# Patient Record
Sex: Female | Born: 1980
Health system: Southern US, Community
[De-identification: ages and names within clinical notes are randomized; demographics above are authoritative.]

## PROBLEM LIST (undated history)

## (undated) DIAGNOSIS — F32A Depression, unspecified: Secondary | ICD-10-CM

## (undated) DIAGNOSIS — F419 Anxiety disorder, unspecified: Secondary | ICD-10-CM

## (undated) DIAGNOSIS — K589 Irritable bowel syndrome without diarrhea: Secondary | ICD-10-CM

## (undated) DIAGNOSIS — G43909 Migraine, unspecified, not intractable, without status migrainosus: Secondary | ICD-10-CM

## (undated) DIAGNOSIS — F329 Major depressive disorder, single episode, unspecified: Secondary | ICD-10-CM

## (undated) HISTORY — PX: CHOLECYSTECTOMY: SHX55

## (undated) HISTORY — DX: Anxiety disorder, unspecified: F41.9

## (undated) HISTORY — DX: Migraine, unspecified, not intractable, without status migrainosus: G43.909

## (undated) HISTORY — DX: Major depressive disorder, single episode, unspecified: F32.9

## (undated) HISTORY — PX: TONSILLECTOMY: SUR1361

## (undated) HISTORY — DX: Depression, unspecified: F32.A

## (undated) HISTORY — DX: Irritable bowel syndrome without diarrhea: K58.9

---

## 2001-03-10 HISTORY — PX: LEEP: SHX91

## 2003-06-08 ENCOUNTER — Other Ambulatory Visit: Payer: Self-pay

## 2004-04-09 ENCOUNTER — Emergency Department: Payer: Self-pay | Admitting: Unknown Physician Specialty

## 2005-12-01 ENCOUNTER — Emergency Department: Payer: Self-pay | Admitting: Emergency Medicine

## 2006-04-05 ENCOUNTER — Encounter: Payer: Self-pay | Admitting: Obstetrics & Gynecology

## 2006-04-05 ENCOUNTER — Ambulatory Visit: Payer: Self-pay | Admitting: Obstetrics & Gynecology

## 2006-06-13 ENCOUNTER — Ambulatory Visit: Payer: Self-pay | Admitting: Family Medicine

## 2006-06-23 ENCOUNTER — Ambulatory Visit (HOSPITAL_COMMUNITY): Admission: RE | Admit: 2006-06-23 | Discharge: 2006-06-23 | Payer: Self-pay | Admitting: Family Medicine

## 2006-06-27 ENCOUNTER — Ambulatory Visit: Payer: Self-pay | Admitting: Family Medicine

## 2006-07-25 ENCOUNTER — Ambulatory Visit: Payer: Self-pay | Admitting: Family Medicine

## 2006-07-28 ENCOUNTER — Ambulatory Visit (HOSPITAL_COMMUNITY): Admission: RE | Admit: 2006-07-28 | Discharge: 2006-07-28 | Payer: Self-pay | Admitting: Obstetrics & Gynecology

## 2006-08-15 ENCOUNTER — Ambulatory Visit: Payer: Self-pay | Admitting: Family Medicine

## 2006-09-04 ENCOUNTER — Ambulatory Visit (HOSPITAL_COMMUNITY): Admission: RE | Admit: 2006-09-04 | Discharge: 2006-09-04 | Payer: Self-pay | Admitting: Obstetrics & Gynecology

## 2006-09-06 ENCOUNTER — Ambulatory Visit (HOSPITAL_COMMUNITY): Admission: RE | Admit: 2006-09-06 | Discharge: 2006-09-06 | Payer: Self-pay | Admitting: Gynecology

## 2006-09-06 ENCOUNTER — Ambulatory Visit: Payer: Self-pay | Admitting: Obstetrics & Gynecology

## 2006-10-04 ENCOUNTER — Ambulatory Visit: Payer: Self-pay | Admitting: Obstetrics & Gynecology

## 2006-11-02 ENCOUNTER — Ambulatory Visit: Payer: Self-pay | Admitting: Obstetrics & Gynecology

## 2006-11-30 ENCOUNTER — Ambulatory Visit: Payer: Self-pay | Admitting: Obstetrics & Gynecology

## 2006-11-30 ENCOUNTER — Ambulatory Visit (HOSPITAL_COMMUNITY): Admission: RE | Admit: 2006-11-30 | Discharge: 2006-11-30 | Payer: Self-pay | Admitting: Gynecology

## 2006-12-01 ENCOUNTER — Ambulatory Visit: Payer: Self-pay | Admitting: Gynecology

## 2006-12-04 ENCOUNTER — Ambulatory Visit: Payer: Self-pay | Admitting: Obstetrics & Gynecology

## 2006-12-06 ENCOUNTER — Ambulatory Visit: Payer: Self-pay | Admitting: Obstetrics & Gynecology

## 2006-12-13 ENCOUNTER — Ambulatory Visit (HOSPITAL_COMMUNITY): Admission: RE | Admit: 2006-12-13 | Discharge: 2006-12-13 | Payer: Self-pay | Admitting: Obstetrics & Gynecology

## 2006-12-13 ENCOUNTER — Ambulatory Visit: Payer: Self-pay | Admitting: Obstetrics & Gynecology

## 2006-12-19 ENCOUNTER — Ambulatory Visit: Payer: Self-pay | Admitting: Family Medicine

## 2006-12-25 ENCOUNTER — Ambulatory Visit: Payer: Self-pay | Admitting: Obstetrics & Gynecology

## 2006-12-27 ENCOUNTER — Ambulatory Visit: Payer: Self-pay | Admitting: Obstetrics & Gynecology

## 2007-01-09 ENCOUNTER — Inpatient Hospital Stay (HOSPITAL_COMMUNITY): Admission: AD | Admit: 2007-01-09 | Discharge: 2007-01-10 | Payer: Self-pay | Admitting: Obstetrics & Gynecology

## 2007-01-09 ENCOUNTER — Ambulatory Visit: Payer: Self-pay | Admitting: Advanced Practice Midwife

## 2007-01-15 ENCOUNTER — Ambulatory Visit: Payer: Self-pay | Admitting: Gynecology

## 2007-01-20 ENCOUNTER — Inpatient Hospital Stay (HOSPITAL_COMMUNITY): Admission: AD | Admit: 2007-01-20 | Discharge: 2007-01-20 | Payer: Self-pay | Admitting: Obstetrics & Gynecology

## 2007-01-22 ENCOUNTER — Ambulatory Visit: Payer: Self-pay | Admitting: Obstetrics & Gynecology

## 2007-01-26 ENCOUNTER — Ambulatory Visit: Payer: Self-pay | Admitting: *Deleted

## 2007-01-26 ENCOUNTER — Inpatient Hospital Stay (HOSPITAL_COMMUNITY): Admission: AD | Admit: 2007-01-26 | Discharge: 2007-01-27 | Payer: Self-pay | Admitting: Obstetrics & Gynecology

## 2007-01-31 ENCOUNTER — Ambulatory Visit: Payer: Self-pay | Admitting: Obstetrics & Gynecology

## 2007-02-02 ENCOUNTER — Ambulatory Visit: Payer: Self-pay | Admitting: Physician Assistant

## 2007-02-02 ENCOUNTER — Inpatient Hospital Stay (HOSPITAL_COMMUNITY): Admission: AD | Admit: 2007-02-02 | Discharge: 2007-02-04 | Payer: Self-pay | Admitting: Gynecology

## 2007-03-11 HISTORY — PX: INTRAUTERINE DEVICE INSERTION: SHX323

## 2007-03-21 ENCOUNTER — Ambulatory Visit: Payer: Self-pay | Admitting: Gynecology

## 2007-03-21 ENCOUNTER — Encounter (INDEPENDENT_AMBULATORY_CARE_PROVIDER_SITE_OTHER): Payer: Self-pay | Admitting: Gynecology

## 2007-03-28 ENCOUNTER — Ambulatory Visit: Payer: Self-pay | Admitting: Obstetrics & Gynecology

## 2008-05-05 ENCOUNTER — Ambulatory Visit: Payer: Self-pay | Admitting: Family Medicine

## 2008-05-05 DIAGNOSIS — F411 Generalized anxiety disorder: Secondary | ICD-10-CM | POA: Insufficient documentation

## 2008-05-05 DIAGNOSIS — R5383 Other fatigue: Secondary | ICD-10-CM

## 2008-05-05 DIAGNOSIS — F329 Major depressive disorder, single episode, unspecified: Secondary | ICD-10-CM

## 2008-05-05 DIAGNOSIS — R5381 Other malaise: Secondary | ICD-10-CM | POA: Insufficient documentation

## 2008-05-05 DIAGNOSIS — F325 Major depressive disorder, single episode, in full remission: Secondary | ICD-10-CM | POA: Insufficient documentation

## 2008-05-05 LAB — CONVERTED CEMR LAB
ALT: 17 units/L (ref 0–35)
BUN: 10 mg/dL (ref 6–23)
Basophils Relative: 1.5 % (ref 0.0–3.0)
CO2: 28 meq/L (ref 19–32)
Chloride: 106 meq/L (ref 96–112)
Cholesterol: 139 mg/dL (ref 0–200)
Eosinophils Relative: 1.9 % (ref 0.0–5.0)
HCT: 42.8 % (ref 36.0–46.0)
LDL Cholesterol: 81 mg/dL (ref 0–99)
Lymphs Abs: 2.6 10*3/uL (ref 0.7–4.0)
MCV: 90.6 fL (ref 78.0–100.0)
Monocytes Absolute: 0.5 10*3/uL (ref 0.1–1.0)
Platelets: 278 10*3/uL (ref 150.0–400.0)
Potassium: 4 meq/L (ref 3.5–5.1)
TSH: 1.89 microintl units/mL (ref 0.35–5.50)
Total Bilirubin: 0.9 mg/dL (ref 0.3–1.2)
Total Protein: 7.2 g/dL (ref 6.0–8.3)
WBC: 7.6 10*3/uL (ref 4.5–10.5)

## 2008-05-07 LAB — CONVERTED CEMR LAB

## 2008-05-09 ENCOUNTER — Telehealth: Payer: Self-pay | Admitting: Family Medicine

## 2008-05-30 ENCOUNTER — Ambulatory Visit: Payer: Self-pay | Admitting: Family Medicine

## 2008-05-30 ENCOUNTER — Encounter: Payer: Self-pay | Admitting: Family Medicine

## 2008-05-30 ENCOUNTER — Other Ambulatory Visit: Admission: RE | Admit: 2008-05-30 | Discharge: 2008-05-30 | Payer: Self-pay | Admitting: Family Medicine

## 2008-06-02 ENCOUNTER — Encounter (INDEPENDENT_AMBULATORY_CARE_PROVIDER_SITE_OTHER): Payer: Self-pay | Admitting: *Deleted

## 2008-07-16 ENCOUNTER — Ambulatory Visit: Payer: Self-pay | Admitting: Family Medicine

## 2008-08-29 ENCOUNTER — Ambulatory Visit: Payer: Self-pay | Admitting: Family Medicine

## 2009-03-03 ENCOUNTER — Ambulatory Visit: Payer: Self-pay | Admitting: Family Medicine

## 2009-05-05 ENCOUNTER — Ambulatory Visit: Payer: Self-pay | Admitting: Family Medicine

## 2009-06-04 ENCOUNTER — Other Ambulatory Visit: Admission: RE | Admit: 2009-06-04 | Discharge: 2009-06-04 | Payer: Self-pay | Admitting: Family Medicine

## 2009-06-04 ENCOUNTER — Ambulatory Visit: Payer: Self-pay | Admitting: Family Medicine

## 2009-06-04 DIAGNOSIS — R609 Edema, unspecified: Secondary | ICD-10-CM | POA: Insufficient documentation

## 2009-06-04 DIAGNOSIS — M461 Sacroiliitis, not elsewhere classified: Secondary | ICD-10-CM | POA: Insufficient documentation

## 2009-06-04 DIAGNOSIS — M549 Dorsalgia, unspecified: Secondary | ICD-10-CM | POA: Insufficient documentation

## 2009-06-09 LAB — CONVERTED CEMR LAB
BUN: 11 mg/dL (ref 6–23)
Bilirubin, Direct: 0.1 mg/dL (ref 0.0–0.3)
CO2: 33 meq/L — ABNORMAL HIGH (ref 19–32)
Chloride: 105 meq/L (ref 96–112)
Creatinine, Ser: 0.7 mg/dL (ref 0.4–1.2)
Eosinophils Absolute: 0.2 10*3/uL (ref 0.0–0.7)
MCHC: 34.3 g/dL (ref 30.0–36.0)
MCV: 91 fL (ref 78.0–100.0)
Monocytes Absolute: 0.6 10*3/uL (ref 0.1–1.0)
Neutrophils Relative %: 62.8 % (ref 43.0–77.0)
Platelets: 258 10*3/uL (ref 150.0–400.0)
Pro B Natriuretic peptide (BNP): 18.1 pg/mL (ref 0.0–100.0)
Total Bilirubin: 0.4 mg/dL (ref 0.3–1.2)
Total Protein: 7.4 g/dL (ref 6.0–8.3)

## 2009-06-10 ENCOUNTER — Encounter (INDEPENDENT_AMBULATORY_CARE_PROVIDER_SITE_OTHER): Payer: Self-pay | Admitting: *Deleted

## 2009-06-10 LAB — CONVERTED CEMR LAB: Pap Smear: NEGATIVE

## 2009-11-04 ENCOUNTER — Ambulatory Visit: Payer: Self-pay | Admitting: Family Medicine

## 2009-11-05 ENCOUNTER — Telehealth: Payer: Self-pay | Admitting: Family Medicine

## 2009-11-16 ENCOUNTER — Ambulatory Visit: Payer: Self-pay | Admitting: Internal Medicine

## 2010-02-09 NOTE — Assessment & Plan Note (Signed)
Summary: CPX /W/PAP/RBH   Vital Signs:  Patient profile:   30 year old female Height:      65.75 inches Weight:      218.4 pounds BMI:     35.65 Temp:     98.2 degrees F oral Pulse rate:   84 / minute Pulse rhythm:   regular BP sitting:   110 / 70  (left arm) Cuff size:   large  Vitals Entered By: Benny Lennert CMA Duncan Dull) (Jun 04, 2009 2:47 PM)  History of Present Illness: Chief complaint cpx with pap, other c/o  30 year old female:   Health maintenance.  2. Back pain, R low sided back pain. No radiculpathy. No specific injury. Has tried ibuprofen. Some heat. No prior longstanding history of back problems, no history of surgery. 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 LE edema, has had a lot more LE edema in the past few weeks as wel  4. Dep / anx - doing well, needs refill of SSRI  Preventive Screening-Counseling & Management  Alcohol-Tobacco     Alcohol drinks/day: <1     Alcohol Counseling: not indicated; patient does not drink     Smoking Status: never     Tobacco Counseling: not indicated; no tobacco use  Caffeine-Diet-Exercise     Caffeine use/day: mild     Diet Comments: improved, more fiber, lower sugar     Diet Counseling: to improve diet; diet is suboptimal     Does Patient Exercise: no     Exercise Counseling: to improve exercise regimen  Hep-HIV-STD-Contraception     Hepatitis Risk: risk noted     Hepatitis Risk Counseling: to avoid increased hepatitis risk     HIV Risk: no risk noted     HIV Risk Counseling: not indicated-no HIV risk noted     STD Risk: no risk noted     STD Risk Counseling: not indicated-no STD risk noted     Contraception Counseling: not applicable     SBE Education/Counseling: to perform regular SBE     Sun Exposure-Excessive: yes     Sun Exposure Counseling: to decrease sun exposure  Safety-Violence-Falls     Violence in the Home: no risk noted     Sexual Abuse: no     Fall Risk Counseling: not  indicated; no significant falls noted      Sexual History:  currently monogamous.        Drug Use:  never.        Blood Transfusions:  no.    Clinical Review Panels:  Prevention   Last Pap Smear:  NEGATIVE FOR INTRAEPITHELIAL LESIONS OR MALIGNANCY. (05/30/2008)  Lipid Management   Cholesterol:  139 (05/05/2008)   LDL (bad choesterol):  81 (05/05/2008)   HDL (good cholesterol):  44.30 (05/05/2008)  CBC   WBC:  7.6 (05/05/2008)   RBC:  4.72 (05/05/2008)   Hgb:  14.7 (05/05/2008)   Hct:  42.8 (05/05/2008)   Platelets:  278.0 (05/05/2008)   MCV  90.6 (05/05/2008)   MCHC  34.2 (05/05/2008)   RDW  11.9 (05/05/2008)   PMN:  55.8 (05/05/2008)   Lymphs:  34.8 (05/05/2008)   Monos:  6.0 (05/05/2008)   Eosinophils:  1.9 (05/05/2008)   Basophil:  1.5 (05/05/2008)  Complete Metabolic Panel   Glucose:  92 (05/05/2008)   Sodium:  140 (05/05/2008)   Potassium:  4.0 (05/05/2008)   Chloride:  106 (05/05/2008)   CO2:  28 (05/05/2008)  BUN:  10 (05/05/2008)   Creatinine:  0.7 (05/05/2008)   Albumin:  4.1 (05/05/2008)   Total Protein:  7.2 (05/05/2008)   Calcium:  9.0 (05/05/2008)   Total Bili:  0.9 (05/05/2008)   Alk Phos:  42 (05/05/2008)   SGPT (ALT):  17 (05/05/2008)   SGOT (AST):  20 (05/05/2008)   Allergies: 1)  ! Morphine 2)  ! Percocet  Past History:  Past medical, surgical, family and social histories (including risk factors) reviewed, and no changes noted (except as noted below).  Past Medical History: Reviewed history from 05/05/2008 and no changes required. Anxiety Depression  Past Surgical History: Reviewed history from 05/05/2008 and no changes required. IUD, Mirena, 03/2007 LEEP, 03/2001  Family History: Reviewed history from 05/05/2008 and no changes required. Alcoholism, Drug Addiction: DAD Colon CA: 0 Ovarian/Uterine CA: 0 Breast CA: GM Lung CA: 0 Prostate CA: 0 CAD: 0 CVA: 0 Sudden death < 50: GF, not sure why DM: 0 Mental Illness: 0    Social History: Reviewed history from 05/05/2008 and no changes required. 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  Cardiovascular: Denies chest pains, palpitations, syncope, dyspnea on exertion,  Respiratory: Denies cough, dyspnea at rest, excessive sputum,wheeezing GI: Denies nausea, vomiting, diarrhea, constipation, change in bowel habits, abdominal pain, melena, hematochezia GU: Denies dysuria, hematuria, discharge, urinary frequency, urinary hesitancy, nocturia, incontinence, genital sores, decreased libido Musculoskeletal: AS ABOVE Derm: Denies rash, itching Neuro: Denies  paresthesias, frequent falls, frequent headaches, and difficulty walking.  Psych: Denies depression, anxiety Endocrine: Denies cold intolerance, heat intolerance, polydipsia, polyphagia, polyuria, and unusual weight change.  Heme: Denies enlarged lymph nodes Allergy: No hayfever  LE EDEMA AS ABOVE  Otherwise, the pertinent positives and negatives are listed above and in the HPI, otherwise a full review of systems has been reviewed and is negative unless noted positive.   Physical Exam  General:  Well-developed,well-nourished,in no acute distress; alert,appropriate and cooperative throughout examination Head:  Normocephalic and atraumatic without obvious abnormalities. No apparent alopecia or balding. Eyes:  pupils equal, pupils round, pupils reactive to light, pupils react to accomodation, corneas and lenses clear, and no injection.   Ears:  External ear exam shows no significant lesions or deformities.  Otoscopic examination reveals clear  canals, tympanic membranes are intact bilaterally without bulging, retraction, inflammation or discharge. Hearing is grossly normal bilaterally. Nose:  External nasal examination shows no deformity or inflammation. Nasal mucosa are pink and moist without lesions or exudates. Mouth:  Oral mucosa and oropharynx without lesions or exudates.  Teeth in good repair. Neck:  No deformities, masses, or tenderness noted. Chest Wall:  No deformities, masses, or tenderness noted. Breasts:  No mass, nodules, thickening, tenderness, bulging, retraction, inflamation, nipple discharge or skin changes noted.   Lungs:  Normal respiratory effort, chest expands symmetrically. Lungs are clear to auscultation, no crackles or wheezes. Heart:  Normal rate and regular rhythm. S1 and S2 normal without gallop, murmur, click, rub or other extra sounds. Abdomen:  Bowel sounds positive,abdomen soft and non-tender without masses, organomegaly or hernias noted. Genitalia:  Normal introitus for age, no external lesions, no vaginal discharge, mucosa pink and moist, no vaginal or cervical lesions, no vaginal atrophy,  no friaility or hemorrhage, normal uterus size and position, no adnexal masses or tenderness Msk:  Range of motion at  the waist: full Flexion, ext, rotation, minimal to no pain  No echymosis or edema Rises to examination table with no difficulty nonantalgic  Inspection/Deformity: N Paraspinus T: Y - R lower  B Ankle Dorsiflexion (L5,4): 5/5 B Great Toe Dorsiflexion (L5,4): 5/5 Heel Walk (L5): WNL Toe Walk (S1): WNL Rise/Squat (L4): WNL, mild pain  SENSORY B Medial Foot (L4): WNL B Dorsum (L5): WNL B Lateral (S1): WNL Light Touch: WNL Pinprick: WNL  REFLEXES Knee (L4): 2+ Ankle (S1): 2+  B SLR, seated: neg B SLR, supine: neg B FABER: neg B Reverse FABER: TTP on R B Greater Troch: NT B Log Roll: neg B Stork: NT B Sciatic Notch: R TTP Leg Lengths: equal  Extremities:  tr - 1+ foot and ankle  edema, tr le edema Neurologic:  alert & oriented X3, sensation intact to light touch, and gait normal.   Cervical Nodes:  No lymphadenopathy noted Axillary Nodes:  No palpable lymphadenopathy Inguinal Nodes:  No significant adenopathy Psych:  Cognition and judgment appear intact. Alert and cooperative with normal attention span and concentration. No apparent delusions, illusions, hallucinations   Impression & Recommendations:  Problem # 1:  HEALTH MAINTENANCE EXAM (ICD-V70.0) The patient's preventative maintenance and recommended screening tests for an annual wellness exam were reviewed in full today. Brought up to date unless services declined.  Counselled on the importance of diet, exercise, and its role in overall health and mortality. The patient's FH and SH was reviewed, including their home life, tobacco status, and drug and alcohol status.   Problem # 2:  BACK PAIN (ICD-724.5) Assessment: New Part SI dyfunction, part focal LBP  Conservative management for now and progress.  Start with medications, core rehab, and progress from there following low back pain algorithm. Does not appear to be acute disk pathology clinically. No red flags.  Her updated medication list for this problem includes:    Diclofenac Sodium 75 Mg Tbec (Diclofenac sodium) .Marland Kitchen... 1 by mouth two times a day    Cyclobenzaprine Hcl 10 Mg Tabs (Cyclobenzaprine hcl) .Marland Kitchen... 1/2 to 1 tab as needed for back pain  Problem # 3:  SACROILIITIS, RIGHT (ICD-720.2) Assessment: New A rehabilitation program was given to the patient emphasizing increasing ROM at [facility] joint, increasing overall hip and piriformis flexibility, and hip, flexor and abductor strength.   Problem # 4:  EDEMA (ICD-782.3) Assessment: New Likely with being up on feet all day Compression stockings  Check bnp and basic labs  Orders: TLB-BNP (B-Natriuretic Peptide) (83880-BNPR)  Problem # 5:  DEPRESSION (ICD-311) Assessment: Improved  Her updated  medication list for this problem includes:    Citalopram Hydrobromide 40 Mg Tabs (Citalopram hydrobromide) .Marland Kitchen... Take one tablet by mouth daily  Problem # 6:  ANXIETY (ICD-300.00) Assessment: Improved  Her updated medication list for this problem includes:    Citalopram Hydrobromide 40 Mg Tabs (Citalopram hydrobromide) .Marland Kitchen... Take one tablet by mouth daily  Complete Medication List: 1)  Citalopram Hydrobromide 40 Mg Tabs (Citalopram hydrobromide) .... Take one tablet by mouth daily 2)  Diclofenac Sodium 75 Mg Tbec (Diclofenac sodium) .Marland Kitchen.. 1 by mouth two times a day 3)  Cyclobenzaprine Hcl 10 Mg Tabs (Cyclobenzaprine hcl) .... 1/2 to 1 tab as needed for back pain  Other Orders: Venipuncture (16109) TLB-BMP (Basic Metabolic Panel-BMET) (80048-METABOL) TLB-CBC Platelet - w/Differential (85025-CBCD) TLB-Hepatic/Liver Function Pnl (80076-HEPATIC) TLB-Cholesterol, Direct LDL (  83721-DIRLDL) Prescriptions: CYCLOBENZAPRINE HCL 10 MG  TABS (CYCLOBENZAPRINE HCL) 1/2 to 1 tab as needed for back pain  #30 x 2   Entered and Authorized by:   Hannah Beat MD   Signed by:   Hannah Beat MD on 06/04/2009   Method used:   Print then Give to Patient   RxID:   [id number] DICLOFENAC SODIUM 75 MG TBEC (DICLOFENAC SODIUM) 1 by mouth two times a day  #60 x 2   Entered and Authorized by:   Hannah Beat MD   Signed by:   Hannah Beat MD on 06/04/2009   Method used:   Print then Give to Patient   RxID:   [id number] CITALOPRAM HYDROBROMIDE 40 MG TABS (CITALOPRAM HYDROBROMIDE) take one tablet by mouth daily  #30 x 11   Entered and Authorized by:   Hannah Beat MD   Signed by:   Hannah Beat MD on 06/04/2009   Method used:   Print then Give to Patient   RxID:   [id number]   Current Allergies (reviewed today): ! MORPHINE ! PERCOCET

## 2010-02-09 NOTE — Letter (Signed)
Summary: Out of Work  [employer] at [facility]  [address] [place], [place] [address]   Phone: [phone]  Fax: [phone]    November 04, 2009   Employee:  Cheyenne Rivera    To Whom It May Concern:   For Medical reasons, please excuse the above named employee from work for the following dates:  Start:   11/04/2009  End:   can return 11/06/2009 if she is feeling better   If you need additional information, please feel free to contact our office.         Sincerely,    Judith Part MD

## 2010-02-09 NOTE — Letter (Signed)
Summary: Results Follow up Letter  [place] at [facility]  [address] [place], [place] [address]   Phone: [phone]  Fax: [phone]    06/10/2009 MRN: [id number]    [facility] [address] [place], [place]  [address]    Dear Cheyenne Rivera,  The following are the results of your recent test(s):  Test         Result    Pap Smear:        Normal __X___  Not Normal _____ Comments:  Yearly follow up is recommended. ______________________________________________________ Cholesterol: LDL(Bad cholesterol):         Your goal is less than:         HDL (Good cholesterol):       Your goal is more than: Comments:  ______________________________________________________ Mammogram:        Normal _____  Not Normal _____ Comments:  ___________________________________________________________________ Hemoccult:        Normal _____  Not normal _______ Comments:    _____________________________________________________________________ Other Tests:    We routinely do not discuss normal results over the telephone.  If you desire a copy of the results, or you have any questions about this information we can discuss them at your next office visit.   Sincerely,    Hannah Beat, M.D.  Salley Scarlet

## 2010-02-09 NOTE — Assessment & Plan Note (Signed)
Summary: ears,st,congested,coughing/alc   Vital Signs:  Patient profile:   30 year old female Height:      65.75 inches Weight:      215.50 pounds BMI:     35.17 Temp:     97.9 degrees F oral Pulse rate:   76 / minute Pulse rhythm:   regular BP sitting:   110 / 70  (left arm) Cuff size:   regular  Vitals Entered By: Linde Gillis CMA Duncan Dull) (March 03, 2009 9:18 AM) CC: ear pain, soret throat, congestion, cough   Acute Visit History:      The patient complains of cough, earache, fever, nasal discharge, sinus problems, and sore throat.  These symptoms began 5 days ago.  She denies headache.  Other comments include: sudden onset body aches continues to get worse Using tylenol.        Her highest temperature has been 102.        The cough interferes with her sleep.  The character of the cough is described as productive.  There is no history of wheezing or shortness of breath associated with her cough.        The earache is located on both sides.        She complains of teeth aching and nasal congestion.        Problems Prior to Update: 1)  Uri  (ICD-465.9) 2)  Wrist Sprain  (ICD-842.00) 3)  Health Maintenance Exam  (ICD-V70.0) 4)  Depression  (ICD-311) 5)  Anxiety  (ICD-300.00) 6)  Fatigue  (ICD-780.79) 7)  Encounter For Long-term Use of Other Medications  (ICD-V58.69) 8)  Screening, Diabetes Mellitus  (ICD-V77.1) 9)  Screening For Lipoid Disorders  (ICD-V77.91) 10)  Sexually Transmitted Disease, Exposure To  (ICD-V01.6)  Current Medications (verified): 1)  Citalopram Hydrobromide 40 Mg Tabs (Citalopram Hydrobromide) .... Take One Tablet By Mouth Daily 2)  Hycodan Suspension .Marland Kitchen.. 1 - 2 Tsp By Mouth At Bedtime As Needed Cough  Allergies: 1)  ! Morphine 2)  ! Percocet  Past History:  Past medical, surgical, family and social histories (including risk factors) reviewed, and no changes noted (except as noted below).  Past Medical History: Reviewed history from  05/05/2008 and no changes required. Anxiety Depression  Past Surgical History: Reviewed history from 05/05/2008 and no changes required. IUD, Mirena, 03/2007 LEEP, 03/2001  Family History: Reviewed history from 05/05/2008 and no changes required. Alcoholism, Drug Addiction: DAD Colon CA: 0 Ovarian/Uterine CA: 0 Breast CA: GM Lung CA: 0 Prostate CA: 0 CAD: 0 CVA: 0 Sudden death < 50: GF, not sure why DM: 0 Mental Illness: 0   Social History: Reviewed history from 05/05/2008 and no changes required. Marital Status: Married Children: 2 Occupation: Sales executive Drug use-no Regular exercise-no Rare ETOH  Review of Systems General:  Complains of fatigue. CV:  Denies chest pain or discomfort. GI:  Denies abdominal pain.  Physical Exam  General:  overweight female in NAD Head:  no maxiallry sinus ttp Ears:  clear fluid B TMS Nose:  nasl discharge clear Mouth:  MMMpharynx pink and moist.   Neck:  no cervical or supraclavicular lymphadenopathy cervical lymphadenopathy.   Lungs:  Normal respiratory effort, chest expands symmetrically. Lungs are clear to auscultation, no crackles or wheezes. Heart:  Normal rate and regular rhythm. S1 and S2 normal without gallop, murmur, click, rub or other extra sounds.   Impression & Recommendations:  Problem # 1:  INFLUENZA, WITH RESPIRATORY SYMPTOMS (ICD-487.1) Discussed symptomatic care.  Hydration, rest.  Call if SOB, cough worsening or prolongued fever. Reviewed flu timeline. She has no health problems that put her at risk for complications and is out of timeline for tamiflu , so we decided against use of tamiflu. Pt agreed. Discussed family prophylaxis, her children are out of recommended timeline for tamiflu prophylaxsis. She was advised to not return to work until 24 hour after fever resolved on no antipyretics.    Complete Medication List: 1)  Citalopram Hydrobromide 40 Mg Tabs (Citalopram hydrobromide) .... Take one tablet by  mouth daily 2)  Hycodan Suspension  .Marland Kitchen.. 1 - 2 tsp by mouth at bedtime as needed cough  Patient Instructions: 1)  Hydration, rest. Call if SOB, cough worsening or prolongued fever. Mucinex, nasal saline.  2)  She was advised to not return to work until 24 hour after fever resolved on no antipyretics.  Prescriptions: HYCODAN SUSPENSION 1 - 2 tsp by mouth at bedtime as needed cough  #8 oz x 0   Entered and Authorized by:   Kerby Nora MD   Signed by:   Kerby Nora MD on 03/03/2009   Method used:   Print then Give to Patient   RxID:   [id number]   Current Allergies (reviewed today): ! MORPHINE ! PERCOCET

## 2010-02-09 NOTE — Progress Notes (Signed)
Summary: needs something for cough  Phone Note Call from Patient   Caller: Patient Call For: Hannah Beat MD Reason for Call: Talk to Nurse Summary of Call: Pt was given cough medicine at last office visit.  This is making her sick, causing nausea.  She asked if something different can be called to [facility], something that will help her sleep. Initial call taken by: Lowella Petties CMA, AAMA,  November 05, 2009 2:28 PM  Follow-up for Phone Call        Tussionex susp, 1/2-1 tsp by mouth at bedtime as needed cough, 240 mL Follow-up by: Hannah Beat MD,  November 05, 2009 2:45 PM  Additional Follow-up for Phone Call Additional follow up Details #1::        Advised pt, medicine called to [facility] pharmacy. Additional Follow-up by: Lowella Petties CMA, AAMA,  November 05, 2009 3:07 PM     Prior Medications: CITALOPRAM HYDROBROMIDE 40 MG TABS (CITALOPRAM HYDROBROMIDE) take one tablet by mouth daily DICLOFENAC SODIUM 75 MG TBEC (DICLOFENAC SODIUM) 1 by mouth two times a day as needed CYCLOBENZAPRINE HCL 10 MG  TABS (CYCLOBENZAPRINE HCL) 1/2 to 1 tab as needed for back pain THERAFLU SEVERE COLD DAYTIME 15-5-325 MG TABS (DM-PHENYLEPHRINE-ACETAMINOPHEN) OTC As directed. THERAFLU SEVERE COLD/CGH NIGHT 25-10-650 MG PACK (DIPHENHYDRAMINE-PE-APAP) OTC As directed. Current Allergies: ! MORPHINE ! PERCOCET

## 2010-02-09 NOTE — Assessment & Plan Note (Signed)
Summary: sore throat/cough/dlo   Vital Signs:  Patient profile:   30 year old female Weight:      214 pounds O2 Sat:      98 % on Room air Temp:     98.5 degrees F oral Pulse rate:   92 / minute Pulse rhythm:   regular BP sitting:   120 / 80  (left arm) Cuff size:   large  Vitals Entered By: Selena Batten Dance CMA (AAMA) (November 16, 2009 3:30 PM)  O2 Flow:  Room air CC: Cough/ear ache   History of Present Illness: JX:BJYNW, congestion.  came in 2 wks ago, congested, ST, coughing.  Took abx (zpack) and cough syrup (tussionex).  Still has tussionex.  Felt better after a few days.  3d ago started having ST, coughing again.  productive of mild mucous.  Taking thermaflu during day, tussionex at night.  doing vicks as well.  Not helping much.  ear fullness R side.    No abd pain, fevers/chills, n/v/d, rashes, myalgias, arthralgias.  + daughters with cough.  both had flu mist last week.  No smokers at home.  no h/o asthma  Missed 2 days work last 2 wks, now left early today.  can't afford to miss much more work.    Current Medications (verified): 1)  Theraflu Severe Cold Daytime 15-5-325 Mg Tabs (Dm-Phenylephrine-Acetaminophen) .... Otc As Directed.  Allergies: 1)  ! Morphine 2)  ! Percocet  Past History:  Past Medical History: Last updated: 05/05/2008 Anxiety Depression  Social History: Last updated: 05/05/2008 Marital Status: Married Children: 2 Occupation: Sales executive Drug use-no Regular exercise-no Rare ETOH PMH-FH-SH reviewed for relevance  Review of Systems       per HPI  Physical Exam  General:  overweight, congested, nontoxic. Head:  normocephalic, atraumatic, and no abnormalities observed.  no sinus tenderness  Eyes:  vision grossly intact, pupils equal, pupils round, pupils reactive to light, and no injection.   Ears:  R ear normal and L ear normal.   Nose:  nares are boggy but clear  Mouth:  clear post nasal drip noted pharynx pink and moist, no  erythema, and no exudates.   Neck:  No deformities, masses, or tenderness noted.  no LAD Lungs:  poor air movement.  no wheezing.  + bibasilar harsh breath sounds.  O2 sat 98%. Heart:  Normal rate and regular rhythm. S1 and S2 normal without gallop, murmur, click, rub or other extra sounds. Pulses:  2+ rad pulses Extremities:  no pedal edema, brisk cap refill Skin:  Intact without suspicious lesions or rashes   Impression & Recommendations:  Problem # 1:  COUGH (ICD-786.2) post-viral vs bronchitis.  already treated with zpack.  no indication of further bacterial infection.  supportive care for now.  given poor air movement, provided with albuterol to open airways.  CXR clear to my read.  if different rad read, will call with change in plan.  red flags to return discussed.  Orders: T-2 View CXR (71020TC)  Complete Medication List: 1)  Theraflu Severe Cold Daytime 15-5-325 Mg Tabs (Dm-phenylephrine-acetaminophen) .... Otc as directed.  Patient Instructions: 1)  Could be residual of bronchitis.  The cough can last up to 4 weeks to go away. 2)  Push fluids.  guaifenesin IR 400mg  1 pill in am and 1 at noon to help cough up mucous. 3)  Use medication as prescribed: albuterol inh every 4-6 hours as needed 4)  Please return if you are not improving as  expected, or if you have high fevers (>101.5) or difficulty swallowing. 5)  Call clinic with questions.  Pleasure to see you today!    Orders Added: 1)  T-2 View CXR [71020TC] 2)  Est. Patient Level III [54627]     Current Allergies (reviewed today): ! MORPHINE ! PERCOCET  Appended Document: sore throat/cough/dlo    Clinical Lists Changes  Medications: Added new medication of VENTOLIN HFA 108 (90 BASE) MCG/ACT AERS (ALBUTEROL SULFATE) 1-2 puffs q4-6 hours as needed wheezing - Signed Rx of VENTOLIN HFA 108 (90 BASE) MCG/ACT AERS (ALBUTEROL SULFATE) 1-2 puffs q4-6 hours as needed wheezing;  #1 x 3;  Signed;  Entered by: Eustaquio Boyden  MD;  Authorized by: Eustaquio Boyden  MD;  Method used: Electronically to [facility] [address], [place], [place]  [address], Ph: [phone], Fax: [phone]    Prescriptions: VENTOLIN HFA 108 (90 BASE) MCG/ACT AERS (ALBUTEROL SULFATE) 1-2 puffs q4-6 hours as needed wheezing  #1 x 3   Entered and Authorized by:   Eustaquio Boyden  MD   Signed by:   Eustaquio Boyden  MD on 11/16/2009   Method used:   Electronically to        [facility]* (retail)       [address]       [place], [place]  [address]       Ph: [phone]       Fax: [phone]   RxID:   [id number]

## 2010-02-09 NOTE — Letter (Signed)
Summary: Out of Work  [employer] at [facility]  [address] [place], [place] [address]   Phone: [phone]  Fax: [phone]    May 05, 2009   Employee:  Cheyenne Rivera    To Whom It May Concern:   For Medical reasons, please excuse the above named employee from work for the following dates:  Start:   05/05/2009  End:   can retun 4/28 if she is feeling better   If you need additional information, please feel free to contact our office.         Sincerely,    Judith Part MD

## 2010-02-09 NOTE — Assessment & Plan Note (Signed)
Summary: fever,cough,ST   Vital Signs:  Patient profile:   30 year old female Height:      65.75 inches Weight:      213 pounds BMI:     34.77 Temp:     97.9 degrees F oral Pulse rate:   84 / minute Pulse rhythm:   regular BP sitting:   108 / 72  (left arm) Cuff size:   large  Vitals Entered By: Lewanda Rife LPN (November 04, 2009 3:12 PM) CC: fever, raw throat, from coughing, non productive cough but this AM pt coughed up green mucus. Pressure behind lt ear.   History of Present Illness: started getting sick on saturday night - really tired and had to lie down sunday started with hoarseness  then sore throat and cough not a lot of nasal symptoms (theraflu-- does help some)  not helping the cough at night  feels sweats and thinks she was febrile pressure behind L ear and a little dizzy   cough is a little prod of green mucous   30 year old has a little cold  last week 2 people at work sick  Allergies: 1)  ! Morphine 2)  ! Percocet  Past History:  Past Medical History: Last updated: 05/05/2008 Anxiety Depression  Past Surgical History: Last updated: 05/05/2008 IUD, Mirena, 03/2007 LEEP, 03/2001  Family History: Last updated: 05/05/2008 Alcoholism, Drug Addiction: DAD Colon CA: 0 Ovarian/Uterine CA: 0 Breast CA: GM Lung CA: 0 Prostate CA: 0 CAD: 0 CVA: 0 Sudden death < 50: GF, not sure why DM: 0 Mental Illness: 0   Social History: Last updated: 05/05/2008 Marital Status: Married Children: 2 Occupation: Sales executive Drug use-no Regular exercise-no Rare ETOH  Risk Factors: Alcohol Use: <1 (06/04/2009) Caffeine Use: mild (06/04/2009) Diet: improved, more fiber, lower sugar (06/04/2009) Exercise: no (06/04/2009)  Risk Factors: Smoking Status: never (06/04/2009)  Review of Systems General:  Complains of chills, fatigue, fever, and malaise. Eyes:  Denies blurring, discharge, and eye irritation. ENT:  Complains of earache, nasal congestion,  postnasal drainage, sinus pressure, and sore throat; denies ear discharge. CV:  Denies chest pain or discomfort and palpitations. Resp:  Complains of cough, sputum productive, and wheezing. GI:  Denies diarrhea, nausea, and vomiting. Derm:  Denies itching, lesion(s), poor wound healing, and rash. Neuro:  Complains of headaches; denies numbness and tingling.  Physical Exam  General:  overweight but generally well appearing  Head:  normocephalic, atraumatic, and no abnormalities observed.  no sinus tenderness  Eyes:  vision grossly intact, pupils equal, pupils round, pupils reactive to light, and no injection.   Ears:  R ear normal and L ear normal.   Nose:  nares are boggy but clear  Mouth:  clear post nasal drip noted pharynx pink and moist, no erythema, and no exudates.   Lungs:  Normal respiratory effort, chest expands symmetrically. Lungs are clear to auscultation, no crackles or wheezes. bs are harsh at bases  Skin:  Intact without suspicious lesions or rashes Cervical Nodes:  No lymphadenopathy noted Psych:  normal affect, talkative and pleasant    Impression & Recommendations:  Problem # 1:  URI (ICD-465.9) Assessment New viral with possible early viral bronchitis recommend sympt care- see pt instructions   given px for hycodan for night (she has used it in past and not allergic)  fluids/ rest / off work tomorrow  update if worse if worse or not imp over wkend will fill px for zpak and call Her updated medication  list for this problem includes:    Diclofenac Sodium 75 Mg Tbec (Diclofenac sodium) .Marland Kitchen... 1 by mouth two times a day as needed    Theraflu Severe Cold Daytime 15-5-325 Mg Tabs (Dm-phenylephrine-acetaminophen) ..... Otc as directed.    Theraflu Severe Cold/cgh Night 25-10-650 Mg Pack (Diphenhydramine-pe-apap) ..... Otc as directed.  Complete Medication List: 1)  Citalopram Hydrobromide 40 Mg Tabs (Citalopram hydrobromide) .... Take one tablet by mouth daily 2)   Diclofenac Sodium 75 Mg Tbec (Diclofenac sodium) .Marland Kitchen.. 1 by mouth two times a day as needed 3)  Cyclobenzaprine Hcl 10 Mg Tabs (Cyclobenzaprine hcl) .... 1/2 to 1 tab as needed for back pain 4)  Theraflu Severe Cold Daytime 15-5-325 Mg Tabs (Dm-phenylephrine-acetaminophen) .... Otc as directed. 5)  Theraflu Severe Cold/cgh Night 25-10-650 Mg Pack (Diphenhydramine-pe-apap) .... Otc as directed. 6)  Hycodan Cough Suspension  .Marland Kitchen.. 1-2 teaspoons by mouth at bedtime as needed for cough 7)  Zithromax Z-pak 250 Mg Tabs (Azithromycin) .... Take by mouth as directed for worsening bronchitis  Patient Instructions: 1)  you can try mucinex over the counter twice daily as directed and nasal saline spray for congestion 2)  tylenol over the counter as directed may help with aches, headache and fever  (I think theraflu has tylenol in it)  3)  call if symptoms worsen or if not improved in 4-5 days  4)  if not improved at that time - go ahead and fill px for zithromax 5)  try the hycodan for cough with caution - due to sedation Prescriptions: ZITHROMAX Z-PAK 250 MG TABS (AZITHROMYCIN) take by mouth as directed for worsening bronchitis  #1 pack x 0   Entered and Authorized by:   Judith Part MD   Signed by:   Judith Part MD on 11/04/2009   Method used:   Print then Give to Patient   RxID:   [id number] [facility] COUGH SUSPENSION 1-2 teaspoons by mouth at bedtime as needed for cough  #120cc x 0   Entered and Authorized by:   Judith Part MD   Signed by:   Judith Part MD on 11/04/2009   Method used:   Print then Give to Patient   RxID:   [id number]    Orders Added: 1)  Est. Patient Level III [[address]]    Current Allergies (reviewed today): ! MORPHINE ! PERCOCET

## 2010-02-09 NOTE — Assessment & Plan Note (Signed)
Summary: FEVER, THROAT,COUGH/DLO   Vital Signs:  Patient profile:   30 year old female Height:      65.75 inches Weight:      215.25 pounds BMI:     35.13 Temp:     98.8 degrees F oral Pulse rate:   84 / minute Pulse rhythm:   regular BP sitting:   110 / 70  (left arm) Cuff size:   large  Vitals Entered By: Lewanda Rife LPN (May 05, 2009 3:32 PM) CC: fever, productive cough with yellow phlegm, h/a and sorethroat (does not want strep test until Dr. Milinda Antis checks pt.)   History of Present Illness: woke up yesterday with cough and sore throat  st worsened as the day went on  faint headache  now really congested and cannot sleep  fever low grade 100.0  dad is sick - with similar symptoms  also sick person at work   taking sudafed and delsym   cough up yellow phlegm   lives with parents that smoke - that is a problem she does not smoke   needs proof of hep B immunity for new job on fri-- needs blood drawn for that   Allergies: 1)  ! Morphine 2)  ! Percocet  Past History:  Past Medical History: Last updated: 05/05/2008 Anxiety Depression  Past Surgical History: Last updated: 05/05/2008 IUD, Mirena, 03/2007 LEEP, 03/2001  Family History: Last updated: 05/05/2008 Alcoholism, Drug Addiction: DAD Colon CA: 0 Ovarian/Uterine CA: 0 Breast CA: GM Lung CA: 0 Prostate CA: 0 CAD: 0 CVA: 0 Sudden death < 50: GF, not sure why DM: 0 Mental Illness: 0   Social History: Last updated: 05/05/2008 Marital Status: Married Children: 2 Occupation: Sales executive Drug use-no Regular exercise-no Rare ETOH  Risk Factors: Alcohol Use: <1 (05/30/2008) Caffeine Use: mild (05/30/2008) Diet: improved, more fiber, lower sugar (05/30/2008) Exercise: no (05/30/2008)  Risk Factors: Smoking Status: never (05/30/2008)  Review of Systems General:  Complains of chills, fatigue, fever, loss of appetite, and malaise. Eyes:  Denies blurring, discharge, and eye pain. ENT:   Complains of nasal congestion, postnasal drainage, sinus pressure, and sore throat; denies earache. CV:  Denies chest pain or discomfort and palpitations. Resp:  Complains of cough and sputum productive; denies pleuritic, shortness of breath, and wheezing. GI:  Denies abdominal pain, diarrhea, nausea, and vomiting. Derm:  Denies lesion(s), poor wound healing, and rash.  Physical Exam  General:  overweight female in NAD Head:  normocephalic, atraumatic, and no abnormalities observed.  no sinus tenderness  Eyes:  vision grossly intact, pupils equal, pupils round, pupils reactive to light, and no injection.   Ears:  R ear normal and L ear normal.   Nose:  nares are injected and congested bilaterally  Mouth:  pharynx pink and moist.  some post nasal drip  Neck:  No deformities, masses, or tenderness noted. Lungs:  Normal respiratory effort, chest expands symmetrically. Lungs are clear to auscultation, no crackles or wheezes. Heart:  Normal rate and regular rhythm. S1 and S2 normal without gallop, murmur, click, rub or other extra sounds. Skin:  Intact without suspicious lesions or rashes Cervical Nodes:  No lymphadenopathy noted Psych:  normal affect, talkative and pleasant    Impression & Recommendations:  Problem # 1:  URI (ICD-465.9) Assessment New viral with ST and head and chest congestion  recommend sympt care- see pt instructions   hycodan refilled off work note  update if not imp in 1 week or worse The following medications  were removed from the medication list:    Delsym 30 Mg/3ml Lqcr (Dextromethorphan polistirex) ..... Otc as directed.  Problem # 2:  HEPATITIS B IMMUNITY (ICD-V05.8) Assessment: New needs proof of immunity for job after shots in 05 (finished series)  Orders: Venipuncture ([address]) T- * Misc. Laboratory test [phone]) Specimen Handling ([address])  Complete Medication List: 1)  Citalopram Hydrobromide 40 Mg Tabs (Citalopram hydrobromide) .... Take one tablet  by mouth daily 2)  Hycodan Suspension  .Marland Kitchen.. 1 - 2 tsp by mouth at bedtime as needed cough  Patient Instructions: 1)  get zyrtec D over the counter and take as directed for nasal symptoms  2)  will refil hycodan for cough  3)  you can also use mucinex to loosen mucinex as well  4)  drink lots of fluids and rest  5)  tylenol as needed for fever 6)  update me if not improving in 1 week  7)  will check hep B titer today  Prescriptions: HYCODAN SUSPENSION 1 - 2 tsp by mouth at bedtime as needed cough  #8 oz x 0   Entered and Authorized by:   Judith Part MD   Signed by:   Judith Part MD on 05/05/2009   Method used:   Print then Give to Patient   RxID:   [id number]   Current Allergies (reviewed today): ! MORPHINE ! PERCOCET

## 2010-02-18 ENCOUNTER — Encounter: Payer: Self-pay | Admitting: Family Medicine

## 2010-02-18 ENCOUNTER — Ambulatory Visit (INDEPENDENT_AMBULATORY_CARE_PROVIDER_SITE_OTHER): Payer: Self-pay | Admitting: Family Medicine

## 2010-02-18 DIAGNOSIS — B353 Tinea pedis: Secondary | ICD-10-CM | POA: Insufficient documentation

## 2010-02-18 DIAGNOSIS — J209 Acute bronchitis, unspecified: Secondary | ICD-10-CM

## 2010-02-25 NOTE — Assessment & Plan Note (Signed)
Summary: ST,COUGH/CLE   BCBS   Vital Signs:  Patient profile:   30 year old female Weight:      213 pounds Temp:     98.2 degrees F oral Pulse rate:   76 / minute Pulse rhythm:   regular BP sitting:   104 / 60  (left arm) Cuff size:   large  Vitals Entered By: Selena Batten Dance CMA Duncan Dull) (February 18, 2010 4:22 PM) CC: Sore throat, cough and ? Athlete's foot   History of Present Illness: CC: ST, cough  3d h/o ST, cough productive of green sputum.  Worse in am, + post tussive emesis as well.  + tooth pain.  Mild congestion.  No fevers/chills, abd pain, new rashes, myalgias, n/v/d.  No HA, ear pain.  No sinus pressure.  Boyfriend sick at home.  No smokers at home.  No h/o asthma but did use inhaler previously.  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  ALSO feet - last few months has noted toenails thicker.  nontender.  + rash underneath R foot.  has tried athlete's foot alum and chlorox bleach.  boyfriend with athlete's foot.  Current Medications (verified): 1)  Citalopram Hydrobromide 40 Mg Tabs (Citalopram Hydrobromide) .Marland Kitchen.. 1 By Mouth Once Daily  Allergies: 1)  ! Morphine 2)  ! Percocet  Past History:  Past Medical History: Last updated: 05/05/2008 Anxiety Depression  Social History: Last updated: 05/05/2008 Marital Status: Married Children: 2 Occupation: Sales executive Drug use-no Regular exercise-no Rare ETOH  Review of Systems       per HPI  Physical Exam  General:  overweight, nontoxic. NAD Head:  normocephalic, atraumatic, and no abnormalities observed.  no sinus tenderness  Eyes:  vision grossly intact, pupils equal, pupils round, pupils reactive to light, and no injection.   Ears:  R ear normal and L ear normal.   Nose:  nares are boggy but clear  Mouth:  pharynx pink and moist, no erythema, and no exudates.   Neck:  No deformities, masses, or tenderness noted.  no LAD Lungs:  poor air movement.  no wheezing.  no crackles. Heart:  Normal  rate and regular rhythm. S1 and S2 normal without gallop, murmur, click, rub or other extra sounds. Abdomen:  Bowel sounds positive,abdomen soft and non-tender without masses, organomegaly or hernias noted. Pulses:  2+ rad pulses Extremities:  no pedal edema. R sole with blistery rash, scaly.  toenails slightly thickened   Impression & Recommendations:  Problem # 1:  ACUTE BRONCHITIS (ICD-466.0) likely viral, could be early bronchitis given productive.  given h/o bad and lingering infections in past, treat aggressively with zpack.  albuterol to help open lungs, tussionex for cough at night.  The following medications were removed from the medication list:    Theraflu Severe Cold Daytime 15-5-325 Mg Tabs (Dm-phenylephrine-acetaminophen) ..... Otc as directed.    Ventolin Hfa 108 (90 Base) Mcg/act Aers (Albuterol sulfate) .Marland Kitchen... 1-2 puffs q4-6 hours as needed wheezing Her updated medication list for this problem includes:    Ventolin Hfa 108 (90 Base) Mcg/act Aers (Albuterol sulfate) .Marland Kitchen... 2 puffs q4-6 hours as needed cough    Tussionex Pennkinetic Er 10-8 Mg/63ml Lqcr (Hydrocod polst-chlorphen polst) ..... One teaspoon two times a day as needed cough    Zithromax Z-pak 250 Mg Tabs (Azithromycin) ..... Use as directed  Problem # 2:  TINEA PEDIS (ICD-110.4)  mild case.  treat with clotrimazole for next few weeks, recommended funginail for toenails.  Her updated  medication list for this problem includes:    Clotrimazole 1 % Crea (Clotrimazole) .Marland Kitchen... Apply to affected area on r sole two times a day x 3-4 wks  Complete Medication List: 1)  Citalopram Hydrobromide 40 Mg Tabs (Citalopram hydrobromide) .Marland Kitchen.. 1 by mouth once daily 2)  Ventolin Hfa 108 (90 Base) Mcg/act Aers (Albuterol sulfate) .... 2 puffs q4-6 hours as needed cough 3)  Tussionex Pennkinetic Er 10-8 Mg/9ml Lqcr (Hydrocod polst-chlorphen polst) .... One teaspoon two times a day as needed cough 4)  Zithromax Z-pak 250 Mg Tabs  (Azithromycin) .... Use as directed 5)  Clotrimazole 1 % Crea (Clotrimazole) .... Apply to affected area on r sole two times a day x 3-4 wks  Patient Instructions: 1)  albuterol for breathing. 2)  tussionex for cough at night. 3)  zpack to fill if not better by weekend. 4)  Push fluids and rest.  viral bronchitis likely. Prescriptions: CLOTRIMAZOLE 1 % CREA (CLOTRIMAZOLE) apply to affected area on R sole two times a day x 3-4 wks  #1 x 0   Entered and Authorized by:   Eustaquio Boyden  MD   Signed by:   Eustaquio Boyden  MD on 02/18/2010   Method used:   Electronically to        [facility]* (retail)       [address]       [place], [place]  [address]       Ph: [phone]       Fax: [phone]   RxID:   [id number] ZITHROMAX Z-PAK 250 MG TABS (AZITHROMYCIN) use as directed  #1 x 0   Entered and Authorized by:   Eustaquio Boyden  MD   Signed by:   Eustaquio Boyden  MD on 02/18/2010   Method used:   Print then Give to Patient   RxID:   [id number] PENNKINETIC ER 10-8 MG/5ML LQCR (HYDROCOD POLST-CHLORPHEN POLST) one teaspoon two times a day as needed cough  #100cc x 0   Entered and Authorized by:   Eustaquio Boyden  MD   Signed by:   Eustaquio Boyden  MD on 02/18/2010   Method used:   Print then Give to Patient   RxID:   [id number] VENTOLIN HFA 108 (90 BASE) MCG/ACT AERS (ALBUTEROL SULFATE) 2 puffs q4-6 hours as needed cough  #1 x 1   Entered and Authorized by:   Eustaquio Boyden  MD   Signed by:   Eustaquio Boyden  MD on 02/18/2010   Method used:   Electronically to        [facility]* (retail)       [address]       [place], [place]  [address]       Ph: [phone]       Fax: [phone]   RxID:   [id number]    Orders Added: 1)  Est. Patient Level III [[address]]    Current Allergies (reviewed today): ! MORPHINE ! PERCOCET

## 2010-04-10 ENCOUNTER — Encounter: Payer: Self-pay | Admitting: Family Medicine

## 2010-04-11 ENCOUNTER — Encounter: Payer: Self-pay | Admitting: Family Medicine

## 2010-05-28 NOTE — Assessment & Plan Note (Signed)
Cheyenne Rivera, CAI               ACCOUNT NO.:  [id number]   MEDICAL RECORD NO.:  [id number]          PATIENT TYPE:  POB   LOCATION:  [facility]         FACILITY:  [facility]   PHYSICIAN:  Elsie Lincoln, MD      DATE OF BIRTH:  [birth year]   DATE OF SERVICE:                                  CLINIC NOTE   Patient is a 30 year old G1, P89 female whose last menstrual period is  unknown because she is on Mirena, who presents for her physical exam/Pap  smear.  She also wants her IUD removed.  She is interested in return to  fertility.  Her boyfriend complains of the strength.  Patient wants to  try YAZ, which is an appropriate birth control pill for her.  She is to  use condoms the first 2 weeks for backup.  She also is going to go ahead  and start taking multivitamin with folic acid in case she does become  pregnant.   PAST MEDICAL HISTORY:  Jaundice as a child.   PAST SURGICAL HISTORY:  A LEEP in 2003.   FAMILY HISTORY:  Her sister has high blood pressure.   GYN HISTORY:  Abnormal Pap smear with subsequent LEEP.  Has not gotten  good followup of the Pap afterwards.  No miscarriages.  One vaginal  delivery that was uncomplicated.   REVIEW OF SYMPTOMS:  Some bruising, but nothing out of the ordinary.  A  26-pound weight gain in the past few months, although eating healthy and  exercising per patient.  She also had a recent upper respiratory  infection, and had some phlegm.   MEDICATIONS:  IUD.   ALLERGIES:  1. BEES.  2. MORPHINE.   PHYSICAL EXAMINATION:  Pulse 60.  Blood pressure 128/83.  Weight 193.  Height 5 feet 6.  GENERAL:  Well-nourished, well-developed, in no apparent distress.  HEENT:  Good dentition.  Normocephalic and atraumatic.  NECK:  Supple.  No thyromegaly.  LUNGS:  Clear to auscultation bilaterally.  HEART:  Regular rate and rhythm.  BREASTS:  No masses.  No lymphadenopathy.  ABDOMEN:  Soft, non-tender, no rebound, no guarding, no organomegaly or  hernias.  GENITALIA:  Tanner 5.  Vagina pink.  Normal rugae.  Cervix is closed and  non-tender.  Uterus midline to slightly retroverted, non-tender.  Adnexa:  No masses, non-tender.  IUD is visualized, and IUD removed.  EXTREMITIES:  non-tender.   ASSESSMENT AND PLAN:  A 30 year old female for Pap smear.  1. Pap smear and cultures done.  2. TSH for weight gain.  3. IUD removed.  4. Prescription with YAZ birth control.  5. Prenatal vitamins.           ______________________________  Elsie Lincoln, MD     KL/MEDQ  D:  04/05/2006  T:  04/05/2006  Job:  [id number]

## 2010-06-03 ENCOUNTER — Other Ambulatory Visit: Payer: Self-pay | Admitting: Family Medicine

## 2010-06-03 DIAGNOSIS — Z13220 Encounter for screening for lipoid disorders: Secondary | ICD-10-CM

## 2010-06-03 DIAGNOSIS — R5381 Other malaise: Secondary | ICD-10-CM

## 2010-06-03 DIAGNOSIS — Z131 Encounter for screening for diabetes mellitus: Secondary | ICD-10-CM

## 2010-06-04 ENCOUNTER — Other Ambulatory Visit (INDEPENDENT_AMBULATORY_CARE_PROVIDER_SITE_OTHER): Payer: BC Managed Care – PPO | Admitting: Family Medicine

## 2010-06-04 DIAGNOSIS — R5383 Other fatigue: Secondary | ICD-10-CM

## 2010-06-04 DIAGNOSIS — Z13220 Encounter for screening for lipoid disorders: Secondary | ICD-10-CM

## 2010-06-04 DIAGNOSIS — R5381 Other malaise: Secondary | ICD-10-CM

## 2010-06-04 DIAGNOSIS — Z131 Encounter for screening for diabetes mellitus: Secondary | ICD-10-CM

## 2010-06-04 LAB — LIPID PANEL
Cholesterol: 127 mg/dL (ref 0–200)
Total CHOL/HDL Ratio: 3
Triglycerides: 46 mg/dL (ref 0.0–149.0)

## 2010-06-04 LAB — BASIC METABOLIC PANEL
CO2: 28 mEq/L (ref 19–32)
Chloride: 105 mEq/L (ref 96–112)
Creatinine, Ser: 0.7 mg/dL (ref 0.4–1.2)
Potassium: 4 mEq/L (ref 3.5–5.1)

## 2010-06-09 ENCOUNTER — Encounter: Payer: Self-pay | Admitting: Family Medicine

## 2010-06-09 ENCOUNTER — Other Ambulatory Visit (HOSPITAL_COMMUNITY)
Admission: RE | Admit: 2010-06-09 | Discharge: 2010-06-09 | Disposition: A | Discharge status: Home / Self Care | Payer: BC Managed Care – PPO | Source: Ambulatory Visit | Attending: Family Medicine | Admitting: Family Medicine

## 2010-06-09 ENCOUNTER — Ambulatory Visit (INDEPENDENT_AMBULATORY_CARE_PROVIDER_SITE_OTHER): Payer: BC Managed Care – PPO | Admitting: Family Medicine

## 2010-06-09 VITALS — BP 120/70 | HR 86 | Temp 98.4°F | Ht 65.75 in | Wt 222.8 lb

## 2010-06-09 DIAGNOSIS — Z803 Family history of malignant neoplasm of breast: Secondary | ICD-10-CM

## 2010-06-09 DIAGNOSIS — Z01419 Encounter for gynecological examination (general) (routine) without abnormal findings: Secondary | ICD-10-CM

## 2010-06-09 DIAGNOSIS — Z0000 Encounter for general adult medical examination without abnormal findings: Secondary | ICD-10-CM | POA: Insufficient documentation

## 2010-06-09 DIAGNOSIS — B353 Tinea pedis: Secondary | ICD-10-CM

## 2010-06-09 HISTORY — DX: Family history of malignant neoplasm of breast: Z80.3

## 2010-06-09 MED ORDER — CITALOPRAM HYDROBROMIDE 40 MG PO TABS: 40.0000 mg | ORAL_TABLET | Freq: Every day | ORAL | Status: DC

## 2010-06-09 MED ORDER — CLOTRIMAZOLE-BETAMETHASONE 1-0.05 % EX CREA: TOPICAL_CREAM | Freq: Two times a day (BID) | CUTANEOUS | Status: DC

## 2010-06-09 MED ORDER — BUPROPION HCL ER (XL) 150 MG PO TB24: 150.0000 mg | ORAL_TABLET | Freq: Every day | ORAL | Status: DC

## 2010-06-09 NOTE — Patient Instructions (Signed)
GERD  Very Common Backflow of stomach acid and contents into esophagus Causes heartburn Can cause chest pain, difficulty swallowing  Prevention 1. Do not smoke 2. Lose weight if overweight 3. Elevate bed 4-6 inches 4. Some foods worsen: chocolate, mint, alcohol, OJ, caffeine, fat, fried or spicy food.  Treatment 1. Mild to Moderate: OTC Zantac or Pepcid often works, take 6-12 weeks, then as needed 2. More severe: OTC Prilosec or OTC Prevacid, Often needed 3-6 months or on daily basis  

## 2010-06-09 NOTE — Progress Notes (Signed)
30 year old female:  Hands, small blisters coming out.   R Tinea Pedis: Has used Tinactin, something by [employer], also some clotrimazole.  Foot, question. Rash.  It is all in the foot region the This has gotten better somewhat, but is not fully better. She really has this on her right side.   Started to get some hearburn pretty bad.   BRCA MGM, multiple sides.   Dep: Stressed out a lot at work. Working a great deal. Her mood is more irritable and volatile. She is arguing with others. She is diminished interest and energy and doing things outside of the work place. On the weekend, she is properly staying at home, and not getting out and wanting to do other things. She feels tired all the time. She is no suicidal or homicidal ideation. She is tolerating the Celexa she is taking now without difficulty.  Preventative Health Maintenance Visit:  Health Maintenance Summary Reviewed and updated, unless pt declines services.  Tobacco History Reviewed. Alcohol: No concerns, no excessive use Exercise Habits: Some activity, rec at least 30 mins 5 times a week STD concerns: no risk or activity to increase risk Drug Use: None  Labs reviewed with the patient.  Lab Results  Component Value Date   CHOL 127 06/04/2010   CHOL 139 05/05/2008   Lab Results  Component Value Date   HDL 49.50 06/04/2010   HDL 09.81 05/05/2008   Lab Results  Component Value Date   LDLCALC 68 06/04/2010   LDLCALC 81 05/05/2008   Lab Results  Component Value Date   TRIG 46.0 06/04/2010   TRIG 70.0 05/05/2008   Lab Results  Component Value Date   CHOLHDL 3 06/04/2010   CHOLHDL 3 05/05/2008   Lab Results  Component Value Date   LDLDIRECT 72.7 06/04/2009    Lab Results  Component Value Date   WBC 10.7* 06/04/2009   HGB 13.9 06/04/2009   HCT 40.5 06/04/2009   MCV 91.0 06/04/2009   PLT 258.0 06/04/2009    Lab Results  Component Value Date   ALT 19 06/04/2009   AST 16 06/04/2009   ALKPHOS 46 06/04/2009   BILITOT 0.4 06/04/2009      Chemistry      Component Value Date/Time   NA 139 06/04/2010 0851   K 4.0 06/04/2010 0851   CL 105 06/04/2010 0851   CO2 28 06/04/2010 0851   BUN 12 06/04/2010 0851   CREATININE 0.7 06/04/2010 0851      Component Value Date/Time   CALCIUM 9.0 06/04/2010 0851   ALKPHOS 46 06/04/2009 1626   AST 16 06/04/2009 1626   ALT 19 06/04/2009 1626   BILITOT 0.4 06/04/2009 1626     General: Denies fever, chills, sweats. No significant weight loss. Eyes: Denies blurring,significant itching ENT: Denies earache, sore throat, and hoarseness.  Cardiovascular: Denies chest pains, palpitations, dyspnea on exertion,  Respiratory: Denies cough, dyspnea at rest,wheeezing Breast: no concerns about lumps GI: ABOVE GU: Denies dysuria, hematuria, urinary hesitancy, nocturia, denies STD risk, no concerns about discharge Musculoskeletal: Denies back pain, joint pain Derm: ABOVE Neuro: Denies  paresthesias, frequent falls, frequent headaches Psych: ABOVE Endocrine: Denies cold intolerance, heat intolerance, polydipsia Heme: Denies enlarged lymph nodes Allergy: No hayfever   Physical Exam  Blood pressure 120/70, pulse 86, temperature 98.4 F (36.9 C), temperature source Oral, height 5' 5.75" (1.67 m), weight 222 lb 12.8 oz (101.061 kg), SpO2 98.00%.  GEN: well developed, well nourished, no acute distress Eyes: conjunctiva  and lids normal, PERRLA, EOMI ENT: TM clear, nares clear, oral exam WNL Neck: supple, no lymphadenopathy, no thyromegaly, no JVD Pulm: clear to auscultation and percussion, respiratory effort normal CV: regular rate and rhythm, S1-S2, no murmur, rub or gallop, no bruits, peripheral pulses normal and symmetric, no cyanosis, clubbing, edema or varicosities Chest: no scars, masses, no gynecomastia   BREAST: no lumps, no axillary LAD, no nipple discharge GI: soft, non-tender; no hepatosplenomegaly, masses; active bowel sounds all quadrants GU: Normal external female  genitalia. Cervix appears intact without lesions or irritation. Vaginal canal normal without ulceration or lesion. Cervix NT to exam. Ovaries neither enlarged nor tender. Lymph: no cervical, axillary or inguinal adenopathy MSK: gait normal, muscle tone and strength WNL, no joint swelling, effusions, discoloration, crepitus  SKIN: Scattered dry skin, right foot. Few small blisters, hands.  Neuro: normal mental status, normal strength, sensation, and motion Psych: alert; oriented to person, place and time, normally interactive and not anxious or depressed in appearance.  A/P: 1. Health Maintenance: The patient's preventative maintenance and recommended screening tests for an annual wellness exam were reviewed in full today. Brought up to date unless services declined.  Counselled on the importance of diet, exercise, and its role in overall health and mortality. The patient's FH and SH was reviewed, including their home life, tobacco status, and drug and alcohol status.  2. Dep: Unstable: Continue Celexa, but will also add Wellbutrin.  3. Tinea pedis. Rash was consistent with tinea pedis on exam. We'll change to Lotrisone cream.

## 2010-06-14 ENCOUNTER — Encounter: Payer: Self-pay | Admitting: *Deleted

## 2010-09-06 ENCOUNTER — Encounter: Payer: Self-pay | Admitting: Family Medicine

## 2010-09-06 ENCOUNTER — Ambulatory Visit (INDEPENDENT_AMBULATORY_CARE_PROVIDER_SITE_OTHER): Payer: BC Managed Care – PPO | Admitting: Family Medicine

## 2010-09-06 VITALS — BP 130/80 | HR 104 | Temp 98.4°F | Wt 230.1 lb

## 2010-09-06 DIAGNOSIS — F329 Major depressive disorder, single episode, unspecified: Secondary | ICD-10-CM

## 2010-09-06 DIAGNOSIS — F3289 Other specified depressive episodes: Secondary | ICD-10-CM

## 2010-09-06 DIAGNOSIS — F411 Generalized anxiety disorder: Secondary | ICD-10-CM

## 2010-09-06 DIAGNOSIS — S93409A Sprain of unspecified ligament of unspecified ankle, initial encounter: Secondary | ICD-10-CM

## 2010-09-06 MED ORDER — HYDROCODONE-ACETAMINOPHEN 5-500 MG PO TABS: 1.0000 | ORAL_TABLET | Freq: Four times a day (QID) | ORAL | Status: AC | PRN

## 2010-09-06 MED ORDER — LORAZEPAM 0.5 MG PO TABS: 0.5000 mg | ORAL_TABLET | Freq: Three times a day (TID) | ORAL | Status: AC | PRN

## 2010-09-06 NOTE — Progress Notes (Signed)
  Subjective:    Patient ID: Cheyenne Rivera, female    DOB: [birth year], 30 y.o.   MRN: [id number]  HPI  Cheyenne Rivera, a 30 y.o. female presents today in the office for the following:    [place] ankle 1 week ago. Throbbing and having some pain with putting pressure on it. Has been icing it and up a lot of the night. Cannot turn it.   For about the last month or 2 will get a lot of anxiety and feel like having a panic attack. Felt like could not breathe. Having at least a couple of times a week. Some interactions are making worse. Panic attacks 2 times a week  The PMH, PSH, Social History, Family History, Medications, and allergies have been reviewed in [facility], and have been updated if relevant.  S: Cheyenne Rivera is a 30 y.o. female who complains of inversion injury to the left ankle 1 weeks ago. There is pain and swelling at the lateral aspect of that ankle. The patient was able to bear weight directly after the injury.  O: She appears well, vital signs are normal.   Physical Exam  Blood pressure 130/80, pulse 104, temperature 98.4 F (36.9 C), temperature source Oral, weight 230 lb 1.9 oz (104.382 kg), SpO2 98.00%.  GEN: WDWN, NAD, Non-toxic, A & O x 3 HEENT: Atraumatic, Normocephalic. Neck supple. No masses, No LAD. Ears and Nose: No external deformity. EXTR: No c/c/e PSYCH: Normally interactive. Conversant. Not depressed or anxious appearing.  Calm demeanor.    There is swelling and tenderness over the lateral malleolus. No tenderness over the medial aspect of the ankle. The fifth metatarsal is not tender. The ankle joint is intact without excessive opening on stressing. The rest of the foot, ankle and leg exam is normal. ATFL and CFL are tender  A: Sprain of ankle  P: Rest and elevate the injured ankle, apply ice intermittently.  Aircast not adequate for patient given extent of pain and up on feet with occupation  Place in Aircast CAM walker boot. Patient  placed in a pneumatic compression-type splint, which has been shown to decrease overall time of rehab, swelling, and faster return to full function.  1. Ankle sprain  HYDROcodone-acetaminophen (VICODIN) 5-500 MG per tablet  2. ANXIETY  LORazepam (ATIVAN) 0.5 MG tablet  3. DEPRESSION     Anxiety, cont celexa and wellbutrin. For now, given a small amount of ativan prn   Review of Systems     Objective:   Physical Exam        Assessment & Plan:

## 2010-09-06 NOTE — Patient Instructions (Signed)
Recheck in 1 month 

## 2010-09-14 ENCOUNTER — Telehealth: Payer: Self-pay | Admitting: *Deleted

## 2010-09-14 DIAGNOSIS — M25579 Pain in unspecified ankle and joints of unspecified foot: Secondary | ICD-10-CM | POA: Insufficient documentation

## 2010-09-14 NOTE — Telephone Encounter (Signed)
Pt was in last week with a sprained ankle and torn ligaments.  Her pain is not any better.  She would like to be referred to an orthopedic, prefers [facility].

## 2010-09-16 NOTE — Telephone Encounter (Signed)
Appt made with Dr Dion Saucier on 09/16/2010.

## 2010-09-30 LAB — CBC
HCT: 38.4
Hemoglobin: 13.3
MCV: 90.2
Platelets: 203
RBC: 4.26
WBC: 12.1 — ABNORMAL HIGH

## 2010-10-04 ENCOUNTER — Encounter: Payer: Self-pay | Admitting: Family Medicine

## 2010-10-04 ENCOUNTER — Ambulatory Visit (INDEPENDENT_AMBULATORY_CARE_PROVIDER_SITE_OTHER): Payer: BC Managed Care – PPO | Admitting: Family Medicine

## 2010-10-04 VITALS — BP 120/78 | HR 87 | Temp 98.5°F | Ht 66.0 in | Wt 228.0 lb

## 2010-10-04 DIAGNOSIS — M25571 Pain in right ankle and joints of right foot: Secondary | ICD-10-CM

## 2010-10-04 DIAGNOSIS — G9050 Complex regional pain syndrome I, unspecified: Secondary | ICD-10-CM

## 2010-10-04 DIAGNOSIS — M25579 Pain in unspecified ankle and joints of unspecified foot: Secondary | ICD-10-CM

## 2010-10-04 MED ORDER — AMITRIPTYLINE HCL 25 MG PO TABS: 25.0000 mg | ORAL_TABLET | Freq: Every day | ORAL | Status: DC

## 2010-10-04 NOTE — Progress Notes (Signed)
  Subjective:    Patient ID: Cheyenne Rivera, female    DOB: [birth year], 29 y.o.   MRN: [id number]  HPI  Cheyenne Rivera, a 30 y.o. female presents today in the office for the following:    Trying to pursue a child custody case of another child, highly stressed. Stressed on at home.   Continued right ankle pain status post sprain one month ago. The patient has been immobilized in an Aircast Cam Walker for the last month. She continues to have some stiffness and some pain is where is some tingling and discomfort in the anterolateral aspect. No recurrent injury. She has done some small degree of home exercise rehabilitation, but is still only doing basic alphabets and motion.  The PMH, PSH, Social History, Family History, Medications, and allergies have been reviewed in [facility], and have been updated if relevant.   Review of Systems REVIEW OF SYSTEMS  GEN: No fevers, chills. Nontoxic. Primarily MSK c/o today. MSK: Detailed in the HPI GI: tolerating PO intake without difficulty Neuro: detailed above Otherwise the pertinent positives of the ROS are noted above.      Objective:   Physical Exam   Physical Exam  Blood pressure 120/78, pulse 87, temperature 98.5 F (36.9 C), temperature source Oral, height 5\' 6"  (1.676 m), weight 228 lb (103.42 kg), SpO2 98.00%.  GEN: WDWN, NAD, Non-toxic, A & O x 3 HEENT: Atraumatic, Normocephalic. Neck supple. No masses, No LAD. Ears and Nose: No external deformity. EXTR: No c/c/e NEURO Normal gait.  PSYCH: Normally interactive. Conversant. Not depressed or anxious appearing.  Calm demeanor.   Right foot: Nontender along the forefoot and phalanges. Nontender at the malleoli. Mildly tender at the medial and deltoid ligaments as well as the calcaneofibular ligaments. Stable drawer test. Stable subtalar tilt test. Neurovascularly intact. Nontender talus, navicular, cuboid, cuneiforms.      Assessment & Plan:

## 2010-10-04 NOTE — Patient Instructions (Signed)
F/u 5-6 weeks 

## 2010-10-07 ENCOUNTER — Telehealth: Payer: Self-pay

## 2010-10-07 MED ORDER — AMITRIPTYLINE HCL 10 MG PO TABS: 10.0000 mg | ORAL_TABLET | Freq: Every day | ORAL | Status: DC

## 2010-10-07 NOTE — Telephone Encounter (Signed)
Pt saw Dr Patsy Lager 10/04/10 given Amitriptyline 25 mg taking 1 tab at 9:30 pm. for nerve pain. Pt said she is sleeping but the drowsiness cares over the entire next day. Pt said she has to work and cannot be drowsy at work.  Pt request another med sent to [facility] pharmacy that will not make her drowsy the next day. Pt uses [facility] if needed and pt can be reached at [phone] or [phone].

## 2010-10-07 NOTE — Telephone Encounter (Signed)
Patient advised.

## 2010-10-07 NOTE — Telephone Encounter (Signed)
i would just decrease the dose. This is the most studied medication with the nerve dysfunction we talked about. Decrease it down to 10 mg - sent to pharmacy.

## 2010-10-11 ENCOUNTER — Other Ambulatory Visit: Payer: Self-pay | Admitting: Family Medicine

## 2010-10-11 ENCOUNTER — Telehealth: Payer: Self-pay | Admitting: *Deleted

## 2010-10-11 MED ORDER — GABAPENTIN 100 MG PO CAPS: 100.0000 mg | ORAL_CAPSULE | Freq: Three times a day (TID) | ORAL | Status: DC

## 2010-10-11 NOTE — Telephone Encounter (Signed)
Patient advised and rx sent to pharmacy  

## 2010-10-11 NOTE — Telephone Encounter (Signed)
Note continued from below, uses [facility] is something else is called in.

## 2010-10-11 NOTE — Telephone Encounter (Signed)
D/c amitryptiline  Gabapentin Taper:  Send in this sig Gabapentin 100 mg, 1 po tid, #90, 1 refill  Explain to patient: 1 po qhs for 1 week Then increase to 1 po bid the following week, then the following week increase to 1 po tid   If for some reason drowsy with increased dose, then go back to lower dose in the titration (ie. Tid back down to bid)

## 2010-10-11 NOTE — Telephone Encounter (Signed)
Pt states the lower dose of amitriptyline is still too high.  She wonders if she just cant take this medicine.

## 2010-10-15 LAB — URINALYSIS, ROUTINE W REFLEX MICROSCOPIC
Glucose, UA: 100 — AB
Ketones, ur: NEGATIVE
Nitrite: NEGATIVE
Specific Gravity, Urine: 1.02
pH: 6

## 2010-10-29 ENCOUNTER — Telehealth: Payer: Self-pay | Admitting: *Deleted

## 2010-10-29 NOTE — Telephone Encounter (Signed)
Patient calling says she has come out of her boot now. She is still having swelling and pain so bad that she cant sleep at night. She want to know what the next step is because she has been dealing with this since august and she wants and answer of what is going on.

## 2010-10-31 NOTE — Telephone Encounter (Signed)
She is supposed to be following up with me. We can address then - cannot adequately manage over phone.

## 2010-11-01 NOTE — Telephone Encounter (Signed)
I am happy to see the patient at any time.

## 2010-11-01 NOTE — Telephone Encounter (Signed)
Patient says that she is just going to go to [facility] to find out what is going on b/c she said that her foot has not even been xray here and something is going on. She said that the only days she is off work is on Thursday and Friday and she cant see you then because you are not here.

## 2010-11-09 ENCOUNTER — Telehealth: Payer: Self-pay | Admitting: *Deleted

## 2010-11-09 NOTE — Telephone Encounter (Signed)
Pt has been seeing you for pain in her foot,and she has a follow up appt with you next week.  The pain is bad at night, throbs, and she cant sleep. She is asking if something can be called in for the pain, so that she can sleep.  Uses [facility].

## 2010-11-09 NOTE — Telephone Encounter (Signed)
rx called to pharmacy and patient advised 

## 2010-11-09 NOTE — Telephone Encounter (Signed)
This is reasonable:  Vicodin 5-500, 1 po q 4-6 hours prn pain, #40, 0 refills  Cannot take and drive - will make drowsy

## 2010-11-16 ENCOUNTER — Telehealth: Payer: Self-pay | Admitting: *Deleted

## 2010-11-16 NOTE — Telephone Encounter (Signed)
That is ok and very reasonable -- just need someone to follow-up with this problem

## 2010-11-16 NOTE — Telephone Encounter (Signed)
Pt was to have follow up appt with you tomorrow for her ankle, but she also has appt with ortho this week for the same problem.  She cancelled appt with you because she cant afford both copays.  She will follow up with you at a later date.  She doesn't want you to think she is not going to follow up.

## 2010-11-17 ENCOUNTER — Ambulatory Visit: Payer: BC Managed Care – PPO | Admitting: Family Medicine

## 2011-04-06 ENCOUNTER — Ambulatory Visit: Payer: Self-pay | Admitting: Otolaryngology

## 2011-05-11 ENCOUNTER — Ambulatory Visit: Payer: BC Managed Care – PPO | Admitting: Family Medicine

## 2011-05-18 ENCOUNTER — Ambulatory Visit (INDEPENDENT_AMBULATORY_CARE_PROVIDER_SITE_OTHER): Payer: BC Managed Care – PPO | Admitting: Family Medicine

## 2011-05-18 ENCOUNTER — Encounter: Payer: Self-pay | Admitting: Family Medicine

## 2011-05-18 VITALS — BP 120/78 | HR 92 | Temp 98.0°F | Ht 66.0 in | Wt 230.0 lb

## 2011-05-18 DIAGNOSIS — M79609 Pain in unspecified limb: Secondary | ICD-10-CM

## 2011-05-18 DIAGNOSIS — M8430XA Stress fracture, unspecified site, initial encounter for fracture: Secondary | ICD-10-CM

## 2011-05-18 DIAGNOSIS — M79606 Pain in leg, unspecified: Secondary | ICD-10-CM

## 2011-05-18 NOTE — Patient Instructions (Signed)
F/u 1 month for full 30 min CPX

## 2011-05-18 NOTE — Progress Notes (Addendum)
  Patient Name: Cheyenne Rivera Date of Birth: 1980-01-26 Age: 31 y.o. Medical Record Number: [id number] Gender: female Date of Encounter: 05/18/2011  History of Present Illness:  BELLAMY RUBEY is a 31 y.o. very pleasant female patient who presents with the following:  4/27 - went to UC and was having some ain and was trhrobbing, was hurting and throbbing XR were normal  L distal fib stress reaction: No distinct injury, but the patient has had some distal tibia pain for least 2 weeks. She went to urgent care on 05/07/2011 on the weekend, and had plain ankle x-rays which were negative. No evidence of occult fracture.  She is on her feet much of the day at work. There is a palpable bump in the distal tibia. No significant lateral and medial ankle pain. No foot pain. She is not having any calf swelling.  Past Medical History, Surgical History, Social History, Family History, Problem List, Medications, and Allergies have been reviewed and updated if relevant.  Review of Systems:  GEN: No fevers, chills. Nontoxic. Primarily MSK c/o today. MSK: Detailed in the HPI GI: tolerating PO intake without difficulty Neuro: No numbness, parasthesias, or tingling associated. Otherwise the pertinent positives of the ROS are noted above.    Physical Examination: Filed Vitals:   05/18/11 0752  BP: 120/78  Pulse: 92  Temp: 98 F (36.7 C)  TempSrc: Oral  Height: 5\' 6"  (1.676 m)  Weight: 230 lb (104.327 kg)  SpO2: 98%    Body mass index is 37.12 kg/(m^2).   GEN: Well-developed,well-nourished,in no acute distress; alert,appropriate and cooperative throughout examination HEENT: Normocephalic and atraumatic without obvious abnormalities. Ears, externally no deformities PULM: Breathing comfortably in no respiratory distress EXT: No clubbing, cyanosis, or edema PSYCH: Normally interactive. Cooperative during the interview. Pleasant. Friendly and conversant. Not anxious or depressed appearing.  Normal, full affect.  ANKLE: L Echymosis: no Edema: no ROM: Full dorsi and plantar flexion, inversion, eversion Distal tibia does have a slight palpable bump and tenderness with percussion. Hop test is positive on 1 jump Gait: heel toe, non-antalgic Lateral Mall: NT Medial Mall: NT Talus: NT Navicular: NT Cuboid: NT Calcaneous: NT Metatarsals: NT 5th MT: NT Phalanges: NT Achilles: NT Plantar Fascia: NT Fat Pad: NT Peroneals: NT Post Tib: NT Great Toe: Nml motion Ant Drawer: neg Talar Tilt: neg ATFL: NT CFL: NT Deltoid: NT Str: 5/5 Other Special tests: none Sensation: intact   Assessment and Plan:  1. Stress reaction of bone   2. Leg pain    Most consistent with distal tibial stress fracture versus stress reaction. Immobilize the patient in a Cam Walker boot, pneumatic, which she has at home already.   We discussed doing a bone scan or MRI, but now we decided to do clinical management to see if she will improve, since I would advise immobilization in any case  Recheck 4 weeks  Addendum: 05/20/2011  In the interval few days since this encounter, the patient is doing worse with increased pain in her lower leg, despite immobilization in a pneumatic CAM walker boot. I think that further imaging is needed to help give [place] a definitive diagnosis and know if she has an occult fracture or other bony lesion. Obtain an MRI of the tib/fib to evaluate for stress fracture, stress reaction, or potentially other bony lesion causing pain at the distal tibia.  Initial plain x-rays at urgent care were negative.

## 2011-05-20 ENCOUNTER — Telehealth: Payer: Self-pay | Admitting: *Deleted

## 2011-05-20 NOTE — Telephone Encounter (Signed)
Can you convert this to a phone note?   Cheyenne called her work and asked them to have her call to speak to you. [name])  Cheyenne want to do an MRI of her leg to help give [place] a definitive diagnosis and make sure we are right.   She should be in her CAM boot with the air bladders inflated to give pressure to the area.   ----- Message ----- From: Consuello Masse, CMA Sent: 05/20/2011 9:02 AM To: Hannah Beat, MD Subject: RE: Scan   Patient advised. She said that the past 2 days she has been hurting a lot more then it did. Patient wants to know what to do next. ----- Message ----- From: Hannah Beat, MD Sent: 05/20/2011 8:22 AM To: Consuello Masse, CMA Subject: FW: Scan   Can you let her know that the u/s she had on 05/12/2011 did not show a DVT.  Cheyenne didn't think so either. Cheyenne don't think the UC called her with the results.   ----- Message ----- From: Lennie Odor Sent: 05/19/2011 11:28 AM To: Hannah Beat, MD Subject: Scan   The image below was scanned by [facility] [[id number]] on 05/19/2011 at 11:28 AM to the following: US VENOUS IMG LOWER UNI LEFT [[id number]] on 05/19/2011:         Media Information File:               Media Manager Notice:      Can you convert this to a phone note?               Cheyenne called her work and asked them to have her call to speak to you. [name])              Cheyenne want to do an MRI of her leg to help give [place] a definitive diagnosis and make sure we are right.               She should be in her CAM boot with the air bladders inflated to give pressure to the area.               ----- Message -----       From: Consuello Masse, CMA       Sent: 05/20/2011 9:02 AM       To: Hannah Beat, MD       Subject: RE: Scan               Patient advised. She said that the past 2 days she has been hurting a lot more then it did. Patient wants to know what to do next.       ----- Message -----       From: Hannah Beat, MD       Sent: 05/20/2011 8:22 AM       To: Consuello Masse, CMA       Subject: FW: Scan               Can you let her know that the u/s she had on 05/12/2011 did not show a DVT.              Cheyenne didn't think so either. Cheyenne don't think the UC called her with the results.               ----- Message -----       From: Lennie Odor       Sent: 05/19/2011 11:28 AM  To: Hannah Beat, MD       Subject: Scan               The image below was scanned by [facility] [[id number]] on 05/19/2011 at 11:28 AM to the following: US VENOUS IMG LOWER UNI LEFT [[id number]] on 05/19/2011:

## 2011-05-20 NOTE — Progress Notes (Signed)
Addended by: Hannah Beat on: 05/20/2011 09:47 AM   Modules accepted: Orders

## 2011-05-20 NOTE — Telephone Encounter (Signed)
Patient advised and will be okay with mri

## 2011-05-23 ENCOUNTER — Telehealth: Payer: Self-pay | Admitting: *Deleted

## 2011-05-23 NOTE — Telephone Encounter (Signed)
MRI  Scheduled and rx called to pharmacy

## 2011-05-23 NOTE — Telephone Encounter (Signed)
Patient wants to know if she can have something for pain at night b/c she cant sleep

## 2011-05-23 NOTE — Telephone Encounter (Signed)
This type of injury normally doesn't require a significant amount of pain meds - 1/2 to 1 tab used sparingly is reasonable. Definitely get MRI as scheduled.  Vicodin 5-500, 1/2 to 1 tab po qhs prn pain, #20, 0 refills

## 2011-05-29 ENCOUNTER — Ambulatory Visit
Admission: RE | Admit: 2011-05-29 | Discharge: 2011-05-29 | Disposition: A | Discharge status: Home / Self Care | Payer: BC Managed Care – PPO | Source: Ambulatory Visit | Attending: Family Medicine | Admitting: Family Medicine

## 2011-05-29 DIAGNOSIS — M79606 Pain in leg, unspecified: Secondary | ICD-10-CM

## 2011-06-23 ENCOUNTER — Encounter: Payer: BC Managed Care – PPO | Admitting: Family Medicine

## 2011-07-04 ENCOUNTER — Encounter: Payer: BC Managed Care – PPO | Admitting: Family Medicine

## 2011-07-11 ENCOUNTER — Encounter: Payer: BC Managed Care – PPO | Admitting: Family Medicine

## 2011-07-21 ENCOUNTER — Ambulatory Visit: Payer: BC Managed Care – PPO | Admitting: Family Medicine

## 2011-08-17 ENCOUNTER — Encounter: Payer: BC Managed Care – PPO | Admitting: Family Medicine

## 2011-08-18 ENCOUNTER — Telehealth: Payer: Self-pay | Admitting: Family Medicine

## 2011-08-18 LAB — CBC
HCT: 43.5 % (ref 35.0–47.0)
HGB: 14.6 g/dL (ref 12.0–16.0)
MCV: 90 fL (ref 80–100)
WBC: 13 10*3/uL — ABNORMAL HIGH (ref 3.6–11.0)

## 2011-08-18 LAB — COMPREHENSIVE METABOLIC PANEL
Albumin: 4.1 g/dL (ref 3.4–5.0)
Anion Gap: 8 (ref 7–16)
Bilirubin,Total: 0.3 mg/dL (ref 0.2–1.0)
Chloride: 106 mmol/L (ref 98–107)
Creatinine: 0.64 mg/dL (ref 0.60–1.30)
EGFR (African American): >60
Osmolality: 280 (ref 275–301)
Potassium: 4 mmol/L (ref 3.5–5.1)
SGOT(AST): 35 U/L (ref 15–37)
Sodium: 140 mmol/L (ref 136–145)
Total Protein: 8 g/dL (ref 6.4–8.2)

## 2011-08-18 LAB — TSH: Thyroid Stimulating Horm: 3.06 u[IU]/mL

## 2011-08-18 LAB — URINALYSIS, COMPLETE
Blood: NEGATIVE
Ketone: NEGATIVE
Nitrite: NEGATIVE
Protein: NEGATIVE
RBC,UR: 1 /HPF (ref 0–5)
Specific Gravity: 1.024 (ref 1.003–1.030)

## 2011-08-18 NOTE — Telephone Encounter (Signed)
Caller: Brilyn/Patient; PCP: Hannah Beat T.; CB#: [phone];   LMP-has Mirena IUD. Patient states she developed "twinging" of left eye, onset 2000 08/17/11. States had complete loss of vision in left eye lasting 30-60 minutes. States loss of vision resolved spontaneously. States she then developed numbness of left arm, onset 2100 08/17/11. States numbness of left arm lasted approximately one hour. States continues to have a "dull numbness" of her left arm. Denies extremity weakness. Patient states she developed left frontal headache, onset 2130 08/17/11. Describes as "dull headache."  States headache remains constant. States headache is at a "5" on 1-10 scale. Denies nausea or vomiting. Afebrile. Denies nuccal rigidity. Denies dizziness. Patient states she has intermittent "fluttering" in chest X 2 months. States she has felt "fluttering" in chest since 1000 08/18/11. Pulse rate 72 at 1400 08/18/11. Patient also states she is experiencing mild light sensitivity and sensation of "room spinning."  Triage per Headache and Irregular Heartbeat Protocol. Care advice given per guidelines. Patient advised to be seen in Emergency Department now for evaluation related to positive triage for sudden change in vision and not previously evaluated. Patient advised to change positions slowly. Patient advised not to drive self. Patient verbalizes understanding and agreeable. Patient states she prefers to be seen at [facility] due to location.

## 2011-08-18 NOTE — Telephone Encounter (Signed)
agree

## 2011-08-19 ENCOUNTER — Observation Stay: Payer: Self-pay | Admitting: Internal Medicine

## 2011-08-22 ENCOUNTER — Encounter: Payer: Self-pay | Admitting: Family Medicine

## 2011-08-22 ENCOUNTER — Ambulatory Visit (INDEPENDENT_AMBULATORY_CARE_PROVIDER_SITE_OTHER): Payer: BC Managed Care – PPO | Admitting: Family Medicine

## 2011-08-22 VITALS — BP 120/72 | HR 98 | Temp 98.4°F | Ht 66.0 in | Wt 238.2 lb

## 2011-08-22 DIAGNOSIS — R110 Nausea: Secondary | ICD-10-CM

## 2011-08-22 DIAGNOSIS — G43909 Migraine, unspecified, not intractable, without status migrainosus: Secondary | ICD-10-CM | POA: Insufficient documentation

## 2011-08-22 MED ORDER — PROMETHAZINE HCL 25 MG PO TABS: 25.0000 mg | ORAL_TABLET | Freq: Four times a day (QID) | ORAL | Status: DC | PRN

## 2011-08-22 MED ORDER — BUTALBITAL-APAP-CAFFEINE 50-500-40 MG PO TABS: 1.0000 | ORAL_TABLET | ORAL | Status: AC | PRN

## 2011-08-22 NOTE — Patient Instructions (Addendum)

## 2011-08-22 NOTE — Progress Notes (Signed)
Nature conservation officer at [facility] [address] [place] [address] Phone: [phone] Fax: [phone]  Date:  08/22/2011   Name:  Cheyenne Rivera   DOB:  20-Jan-1980   MRN:  [id number] Gender: female  Age: 31 y.o.  PCP:  Cheyenne Beat, MD    Chief Complaint: Follow-up   History of Present Illness:  Cheyenne Rivera is a 31 y.o. pleasant patient who presents with the following:  D/c Hosp 08/20/2011, [facility]:  Head is still hurting right now, neck is hurting right now, stress and tension right now.  Sister has a migraine headache. Aunt also has migraines, will pass out.  She continues to have some headache, though less, no neuro signs, and some nausea  Had an epiosde a few months ago, were could not get rid of a headache. This last time and the time before, light and sound would bother her.  Strokes are also in her family, but MRI and CT of the head were normal.   Was given some zofran and dilaudid in the hospital. When able to take phenergan --- with her tonsils out.   Sensation has not gone away, but last night could not really get away, b/c head was hurting.   All hospital records are reviewed in full.  Patient Active Problem List  Diagnosis  . ANXIETY  . DEPRESSION  . SACROILIITIS, RIGHT  . BACK PAIN  . Family history of breast cancer    Past Medical History  Diagnosis Date  . Anxiety   . Depression     Past Surgical History  Procedure Date  . Intrauterine device insertion 03/2007    Mirena  . Leep 3/03    History  Substance Use Topics  . Smoking status: Never Smoker   . Smokeless tobacco: Not on file  . Alcohol Use: Yes     rare    Family History  Problem Relation Age of Onset  . Alcohol abuse Father   . Colon cancer Neg Hx   . Ovarian cancer Neg Hx   . Breast cancer      Grandmother  . Lung cancer Neg Hx   . Prostate cancer Neg Hx   . Coronary artery disease Neg Hx   . Stroke Neg Hx   . Sudden death      >50, Grand father, ?cause    . Diabetes Neg Hx   . Mental illness Neg Hx     Allergies  Allergen Reactions  . Morphine     REACTION: rash and nausea  . Oxycodone-Acetaminophen     REACTION: rash    Medication list has been reviewed and updated.  No current outpatient prescriptions on file prior to visit.    Review of Systems:  ROS: GEN: Acute illness details above GI: Tolerating PO intake GU: maintaining adequate hydration and urination Pulm: No SOB Interactive and getting along well at home.  Otherwise, ROS is as per the HPI.   Physical Examination: Filed Vitals:   08/22/11 1459  BP: 120/72  Pulse: 98  Temp: 98.4 F (36.9 C)   Filed Vitals:   08/22/11 1459  Height: 5\' 6"  (1.676 m)  Weight: 238 lb 4 oz (108.069 kg)   Body mass index is 38.45 kg/(m^2). Ideal Body Weight: Weight in (lb) to have BMI = 25: 154.6    GEN: WDWN, NAD, Non-toxic, A & O x 3 HEENT: Atraumatic, Normocephalic. Neck supple. No masses, No LAD. Ears and Nose: No external deformity. CV: RRR,  No M/G/R. No JVD. No thrill. No extra heart sounds. PULM: CTA B, no wheezes, crackles, rhonchi. No retractions. No resp. distress. No accessory muscle use. ABD: S, NT, ND, +BS. No rebound tenderness. No HSM.  EXTR: No c/c/e  Neuro: CN 2-12 grossly intact. PERRLA. EOMI. Sensation intact throughout.   A and o x 4.   PSYCH: Normally interactive. Conversant. Not depressed or anxious appearing.  Calm demeanor.     Assessment and Plan:  1. Migraine headache   2. Nausea    Reviewed migraine Refer to the patient instructions sections for details of plan shared with patient.  Diet reviewed fiorecet and phenergan prn for now  Orders Today:  No orders of the defined types were placed in this encounter.    Medications Today: (Includes new updates added during medication reconciliation) Meds ordered this encounter  Medications  . butalbital-acetaminophen-caffeine (ESGIC PLUS) 50-500-40 MG per tablet    Sig: Take 1 tablet by  mouth every 4 (four) hours as needed for pain.    Dispense:  40 tablet    Refill:  1  . promethazine (PHENERGAN) 25 MG tablet    Sig: Take 1 tablet (25 mg total) by mouth every 6 (six) hours as needed for nausea.    Dispense:  30 tablet    Refill:  2    Medications Discontinued: Medications Discontinued During This Encounter  Medication Reason  . citalopram (CELEXA) 40 MG tablet Error    Labs Results from 08/22/2011 that have returned and recent labs: Results for orders placed in visit on 06/04/10  LIPID PANEL      Component Value Range   Cholesterol 127  0 - 200 mg/dL   Triglycerides 16.1  0.0 - 149.0 mg/dL   HDL 09.60  >45.40 mg/dL   VLDL 9.2  0.0 - 98.1 mg/dL   LDL Cholesterol 68  0 - 99 mg/dL   Total CHOL/HDL Ratio 3    BASIC METABOLIC PANEL      Component Value Range   Sodium 139  135 - 145 mEq/L   Potassium 4.0  3.5 - 5.1 mEq/L   Chloride 105  96 - 112 mEq/L   CO2 28  19 - 32 mEq/L   Glucose, Bld 98  70 - 99 mg/dL   BUN 12  6 - 23 mg/dL   Creatinine, Ser 0.7  0.4 - 1.2 mg/dL   Calcium 9.0  8.4 - 19.1 mg/dL   GFR 47.82  >95.62 mL/min  TSH      Component Value Range   TSH 2.48  0.35 - 5.50 uIU/mL     Cheyenne Beat, MD

## 2011-08-25 ENCOUNTER — Telehealth: Payer: Self-pay

## 2011-08-25 MED ORDER — SUMATRIPTAN SUCCINATE 50 MG PO TABS: 50.0000 mg | ORAL_TABLET | ORAL | Status: DC | PRN

## 2011-08-25 NOTE — Telephone Encounter (Signed)
Pt seen 08/22/11;had h/a for one week; h/a no better and esgic plus not helping. No N&V now.[place] [facility]. Pt's children have head lice; pt does not see evidence of head lice or itching for herself; what can get OTC to prevent  head lice.Please advise.

## 2011-08-25 NOTE — Telephone Encounter (Signed)
Call  Send in some Imitrex 50 mg, 1 po an onset of headache, may repeat once after 2 hours. #9, 1 refill  But please call, make sure she knows she can only take 1, then repeat in 2 hours, Not repetively. People usually feel some flushing, drowsiness, and occaisionally some chest pain with this - but those are routine reactions, not emergent side effects  Lice: I would go ahead and get NIX over the counter and treat herself, pretty easy treatment and will ensure no lice  Hope she feels better

## 2011-08-25 NOTE — Telephone Encounter (Signed)
Patient advised and medication sent to pharmacy  

## 2011-09-01 ENCOUNTER — Other Ambulatory Visit (HOSPITAL_COMMUNITY)
Admission: RE | Admit: 2011-09-01 | Discharge: 2011-09-01 | Disposition: A | Discharge status: Home / Self Care | Payer: BC Managed Care – PPO | Source: Ambulatory Visit | Attending: Family Medicine | Admitting: Family Medicine

## 2011-09-01 ENCOUNTER — Encounter: Payer: Self-pay | Admitting: Family Medicine

## 2011-09-01 ENCOUNTER — Ambulatory Visit (INDEPENDENT_AMBULATORY_CARE_PROVIDER_SITE_OTHER): Payer: BC Managed Care – PPO | Admitting: Family Medicine

## 2011-09-01 VITALS — BP 120/90 | HR 76 | Temp 97.8°F | Wt 234.0 lb

## 2011-09-01 DIAGNOSIS — Z0000 Encounter for general adult medical examination without abnormal findings: Secondary | ICD-10-CM

## 2011-09-01 DIAGNOSIS — IMO0002 Reserved for concepts with insufficient information to code with codable children: Secondary | ICD-10-CM

## 2011-09-01 DIAGNOSIS — G43709 Chronic migraine without aura, not intractable, without status migrainosus: Secondary | ICD-10-CM

## 2011-09-01 DIAGNOSIS — Z124 Encounter for screening for malignant neoplasm of cervix: Secondary | ICD-10-CM

## 2011-09-01 DIAGNOSIS — G43909 Migraine, unspecified, not intractable, without status migrainosus: Secondary | ICD-10-CM

## 2011-09-01 DIAGNOSIS — Z01419 Encounter for gynecological examination (general) (routine) without abnormal findings: Secondary | ICD-10-CM | POA: Insufficient documentation

## 2011-09-01 MED ORDER — AMITRIPTYLINE HCL 50 MG PO TABS: 50.0000 mg | ORAL_TABLET | Freq: Every day | ORAL | Status: DC

## 2011-09-01 MED ORDER — ZOLMITRIPTAN 5 MG PO TBDP: ORAL_TABLET | ORAL | Status: DC

## 2011-09-01 MED ORDER — KETOROLAC TROMETHAMINE 60 MG/2ML IM SOLN
60.0000 mg | Freq: Once | INTRAMUSCULAR | Status: AC
  Administered 2011-09-01: 60 mg via INTRAMUSCULAR

## 2011-09-01 NOTE — Progress Notes (Signed)
Nature conservation officer at [facility] [address] [place] [address] Phone: [phone] Fax: [phone]  Date:  09/01/2011   Name:  Cheyenne Rivera   DOB:  [birth year]   MRN:  [id number] Gender: female  Age: 31 y.o.  PCP:  Hannah Beat, MD    Chief Complaint: Headache since last night, nausea.   History of Present Illness:  Cheyenne Rivera is a 31 y.o. very pleasant female patient who presents with the following:  The patient presents for a complete physical examination, and then she also has some acute migraine as well as chronic migraine, and was actually hospitalized with the last couple of weeks, and has had cardiac hospital followup. She has had some diminishing of her headache since her hospitalization, but it over the last few days has become a crescendo again. She had a very large workup that was previously detailed and reviewed including normal imaging of her brain.  Started right away, and never got rid of headache.  Took some imitrex and phenergan and that did not help.  Head still hurting and feeling nauseous.  Some photophobia.  Health Maintenance Summary Reviewed and updated, unless pt declines services.  Tobacco History Reviewed. Non-smoker Alcohol: No concerns, no excessive use Exercise Habits: Some activity, rec at least 30 mins 5 times a week STD concerns: none Drug Use: None Birth control method: Menses regular: yes Lumps or breast concerns: no Breast Cancer Family History: no  Health Maintenance  Topic Date Due  . Tetanus/tdap  11/14/1998  . Influenza Vaccine  10/11/2011  . Pap Smear  06/08/2013    Labs reviewed with the patient.  Results for orders placed in visit on 06/04/10  LIPID PANEL      Component Value Range   Cholesterol 127  0 - 200 mg/dL   Triglycerides 29.5  0.0 - 149.0 mg/dL   HDL 28.41  >32.44 mg/dL   VLDL 9.2  0.0 - 01.0 mg/dL   LDL Cholesterol 68  0 - 99 mg/dL   Total CHOL/HDL Ratio 3    BASIC METABOLIC PANEL   Component Value Range   Sodium 139  135 - 145 mEq/L   Potassium 4.0  3.5 - 5.1 mEq/L   Chloride 105  96 - 112 mEq/L   CO2 28  19 - 32 mEq/L   Glucose, Bld 98  70 - 99 mg/dL   BUN 12  6 - 23 mg/dL   Creatinine, Ser 0.7  0.4 - 1.2 mg/dL   Calcium 9.0  8.4 - 27.2 mg/dL   GFR 53.66  >44.03 mL/min  TSH      Component Value Range   TSH 2.48  0.35 - 5.50 uIU/mL   Also, all recent labs at [facility] reviewed.  Past Medical History, Surgical History, Social History, Family History, Problem List, Medications, and Allergies have been reviewed and updated if relevant.  Current Outpatient Prescriptions on File Prior to Visit  Medication Sig Dispense Refill  . butalbital-acetaminophen-caffeine (ESGIC PLUS) 50-500-40 MG per tablet Take 1 tablet by mouth every 4 (four) hours as needed for pain.  40 tablet  1  . SUMAtriptan (IMITREX) 50 MG tablet Take 1 tablet (50 mg total) by mouth every 2 (two) hours as needed for migraine.  9 tablet  1  . DISCONTD: promethazine (PHENERGAN) 25 MG tablet Take 1 tablet (25 mg total) by mouth every 6 (six) hours as needed for nausea.  30 tablet  2    Review of Systems:  General: Denies fever, chills, sweats. No significant weight loss. Eyes: Denies blurring,significant itching ENT: Denies earache, sore throat, and hoarseness.  Cardiovascular: Denies chest pains, palpitations, dyspnea on exertion,  Respiratory: Denies cough, dyspnea at rest,wheeezing Breast: no concerns about lumps GI: Denies nausea, vomiting, diarrhea, constipation, change in bowel habits, abdominal pain, melena, hematochezia GU: Denies dysuria, hematuria, urinary hesitancy, nocturia, denies STD risk, no concerns about discharge Musculoskeletal: Denies back pain, joint pain Derm: Denies rash, itching Neuro: continued ongoing headaches. Consciousness. My wrist symptoms. No slurred speech. No weakness. No balance disturbance. Otherwise neurologically normal. Psych: moderately anxious today Endocrine:  Denies cold intolerance, heat intolerance, polydipsia Heme: Denies enlarged lymph nodes Allergy: No hayfever   Physical Examination: Filed Vitals:   09/01/11 0828  BP: 120/90  Pulse: 76  Temp: 97.8 F (36.6 C)   Filed Vitals:   09/01/11 0828  Weight: 234 lb (106.142 kg)   There is no height on file to calculate BMI. Ideal Body Weight:     Wt Readings from Last 3 Encounters:  09/01/11 234 lb (106.142 kg)  08/22/11 238 lb 4 oz (108.069 kg)  05/18/11 230 lb (104.327 kg)    GEN: well developed, well nourished, no acute distress - but appears uncomfortable Eyes: conjunctiva and lids normal, PERRLA, EOMI ENT: TM clear, nares clear, oral exam WNL Neck: supple, no lymphadenopathy, no thyromegaly, no JVD Pulm: clear to auscultation and percussion, respiratory effort normal CV: regular rate and rhythm, S1-S2, no murmur, rub or gallop, no bruits Chest: no scars, masses, no lumps BREAST: no lumps, no axillary LAD, no nipple discharge GI: soft, non-tender; no hepatosplenomegaly, masses; active bowel sounds all quadrants GU: Normal external female genitalia. Cervix appears intact without lesions or irritation. Vaginal canal normal without ulceration or lesion. Cervix NT to exam. Ovaries neither enlarged nor tender. Lymph: no cervical, axillary or inguinal adenopathy MSK: gait normal, muscle tone and strength WNL, no joint swelling, effusions, discoloration, crepitus  SKIN: clear, good turgor, color WNL, no rashes, lesions, or ulcerations Neuro: normal mental status, normal strength, sensation, and motion, CN 2-12 grossly intact Psych: alert; oriented to person, place and time, mild anxiety   Assessment and Plan:  1. Routine general medical examination at a health care facility    2. Screening for cervical cancer  Cytology - PAP  3. Migraine  ketorolac (TORADOL) injection 60 mg  4. Chronic migraine     The patient's preventative maintenance and recommended screening tests for an  annual wellness exam were reviewed in full today. Brought up to date unless services declined.  Counselled on the importance of diet, exercise, and its role in overall health and mortality. The patient's FH and SH was reviewed, including their home life, tobacco status, and drug and alcohol status.   Pap today, abnormal priors IM toradol now  Migraine, continues to have acute on chronic migraine. Long discussion with the patient, 15 minutes in discussion and about migraine over and above physical examination. We'll start amitriptyline at nighttime for prophylaxis, switch to Zomig orally disintegrating tablet to use at the initial onset of headache. Toradol right now the office, and she can also take Phenergan as needed. Recheck in 3-4 wks  Orders Today:  No orders of the defined types were placed in this encounter.    Medications Today: (Includes new updates added during medication reconciliation) Meds ordered this encounter  Medications  . promethazine (PHENERGAN) 25 MG tablet    Sig: Take 25 mg by mouth every 6 (six) hours as  needed.  Marland Kitchen amitriptyline (ELAVIL) 50 MG tablet    Sig: Take 1 tablet (50 mg total) by mouth at bedtime.    Dispense:  30 tablet    Refill:  3  . zolmitriptan (ZOMIG-ZMT) 5 MG disintegrating tablet    Sig: 1 po for headache, may repeat in 2 hours. Max 2/24 hours    Dispense:  10 tablet    Refill:  11  . ketorolac (TORADOL) injection 60 mg    Sig:     Medications Discontinued: Medications Discontinued During This Encounter  Medication Reason  . promethazine (PHENERGAN) 25 MG tablet   . SUMAtriptan (IMITREX) 50 MG tablet      Hannah Beat, MD

## 2011-09-05 ENCOUNTER — Telehealth: Payer: Self-pay | Admitting: *Deleted

## 2011-09-05 ENCOUNTER — Encounter: Payer: Self-pay | Admitting: *Deleted

## 2011-09-05 MED ORDER — PROPRANOLOL HCL ER 80 MG PO CP24: 80.0000 mg | ORAL_CAPSULE | Freq: Every day | ORAL | Status: DC

## 2011-09-05 NOTE — Telephone Encounter (Signed)
Patient states that she still has a headache. Patient states that she was given Amitriptyline and she has taken this before for depression which made her sleepy and she had to stop it. Patient is reluctant to try this again since she could not tolerate it previously. Pharmacy- [facility]

## 2011-09-05 NOTE — Telephone Encounter (Signed)
Patient called back and I advised results, pt wanted rx [facility] [address] due to the time. rx [facility]

## 2011-09-05 NOTE — Telephone Encounter (Signed)
D/c amitryptiline Note in chart  Send in Inderal LA (24 hour ER) 80 mg, 1 po daily, #30, 2 refills  This is a daily medication - meant to be taken daily as a prophylactic. Start 1 daily.  This is not abortive medication, but designed to decrease frequency.  Call me on Wed if not getting better, and we may have to get help from neurology

## 2011-09-05 NOTE — Telephone Encounter (Signed)
.  left message to have patient return my call. Have not sent in rx yet, waiting to hear from patient, did d/c amitriptyline

## 2012-01-24 ENCOUNTER — Encounter: Payer: Self-pay | Admitting: Family Medicine

## 2012-01-24 ENCOUNTER — Ambulatory Visit (INDEPENDENT_AMBULATORY_CARE_PROVIDER_SITE_OTHER): Payer: BC Managed Care – PPO | Admitting: Family Medicine

## 2012-01-24 VITALS — BP 120/84 | HR 77 | Temp 98.2°F | Ht 66.0 in | Wt 215.5 lb

## 2012-01-24 DIAGNOSIS — F411 Generalized anxiety disorder: Secondary | ICD-10-CM

## 2012-01-24 DIAGNOSIS — F329 Major depressive disorder, single episode, unspecified: Secondary | ICD-10-CM

## 2012-01-24 DIAGNOSIS — F3289 Other specified depressive episodes: Secondary | ICD-10-CM

## 2012-01-24 DIAGNOSIS — N912 Amenorrhea, unspecified: Secondary | ICD-10-CM

## 2012-01-24 DIAGNOSIS — Z113 Encounter for screening for infections with a predominantly sexual mode of transmission: Secondary | ICD-10-CM

## 2012-01-24 MED ORDER — TRAZODONE HCL 50 MG PO TABS: 25.0000 mg | ORAL_TABLET | Freq: Every evening | ORAL | Status: DC | PRN

## 2012-01-24 MED ORDER — LORAZEPAM 0.5 MG PO TABS: 0.5000 mg | ORAL_TABLET | Freq: Every day | ORAL | Status: DC | PRN

## 2012-01-24 MED ORDER — VENLAFAXINE HCL ER 37.5 MG PO TB24: ORAL_TABLET | ORAL | Status: DC

## 2012-01-24 NOTE — Assessment & Plan Note (Signed)
Known exposure. Will send for testing. GYN exam nml today.

## 2012-01-24 NOTE — Assessment & Plan Note (Signed)
See discussion in depression section.

## 2012-01-24 NOTE — Assessment & Plan Note (Signed)
Temporarily use ativan for anxiety. Trazodone for sleep.  Start venlafaxine as no SE of weight gain.  Start personal and relationship counseling..referral sent.

## 2012-01-24 NOTE — Patient Instructions (Addendum)
Stop at front desk for counsleing referral. Start trazodone for sleep.. If tolerating after 1-2 days then start venlafaxine at bedtime. Increase venlafaxine to maintenance dose after 1 week if tolerating. Use ativan for anxiety as needed, limit use.  Follow up in 1 month with Dr. Patsy Lager or myself.

## 2012-01-24 NOTE — Progress Notes (Signed)
  Subjective:    Patient ID: Cheyenne Rivera, female    DOB: 04-Oct-1980, 32 y.o.   MRN: [id number]  HPI 32 year old female pt of Dr. Cyndie Chime presents with several months of increased stress. She feels that she has had depression fro last few years.  She has anger management issues.  In last few days she has found out boyfriend cheater on him.. She found then.  She has not been able to sleep and eat.  She feels very anxious overall. Tearful a lot as well.  No SI, no HI.   Mother and sister supportive.   In past she had been on celexa .Marland Kitchen She stopped due to weight gain.  Part of depression is weight issues.   Review of Systems  Constitutional: Negative for fever and fatigue.  HENT: Negative for ear pain.   Eyes: Negative for pain.  Respiratory: Negative for chest tightness and shortness of breath.   Cardiovascular: Negative for chest pain, palpitations and leg swelling.  Gastrointestinal: Negative for abdominal pain.  Genitourinary: Negative for dysuria.       Ammenorhea       Objective:   Physical Exam  Constitutional: Vital signs are normal. She appears well-developed and well-nourished. She is cooperative.  Non-toxic appearance. She does not appear ill. No distress.  HENT:  Head: Normocephalic.  Right Ear: Hearing, tympanic membrane, external ear and ear canal normal. Tympanic membrane is not erythematous, not retracted and not bulging.  Left Ear: Hearing, tympanic membrane, external ear and ear canal normal. Tympanic membrane is not erythematous, not retracted and not bulging.  Nose: No mucosal edema or rhinorrhea. Right sinus exhibits no maxillary sinus tenderness and no frontal sinus tenderness. Left sinus exhibits no maxillary sinus tenderness and no frontal sinus tenderness.  Mouth/Throat: Uvula is midline, oropharynx is clear and moist and mucous membranes are normal.  Eyes: Conjunctivae normal, EOM and lids are normal. Pupils are equal, round, and reactive to light. No  foreign bodies found.  Neck: Trachea normal and normal range of motion. Neck supple. Carotid bruit is not present. No mass and no thyromegaly present.  Cardiovascular: Normal rate, regular rhythm, S1 normal, S2 normal, normal heart sounds, intact distal pulses and normal pulses.  Exam reveals no gallop and no friction rub.   No murmur heard. Pulmonary/Chest: Effort normal and breath sounds normal. Not tachypneic. No respiratory distress. She has no decreased breath sounds. She has no wheezes. She has no rhonchi. She has no rales.  Abdominal: Soft. Normal appearance and bowel sounds are normal. There is no tenderness.  Genitourinary: There is no rash, tenderness, lesion or injury on the right labia. There is no rash, tenderness, lesion or injury on the left labia. Cervix exhibits no motion tenderness, no discharge and no friability. No erythema, tenderness or bleeding around the vagina. No foreign body around the vagina. No signs of injury around the vagina. No vaginal discharge found.  Neurological: She is alert.  Skin: Skin is warm, dry and intact. No rash noted.  Psychiatric: Her speech is normal and behavior is normal. Judgment and thought content normal. Her mood appears not anxious. Cognition and memory are normal. She exhibits a depressed mood.          Assessment & Plan:

## 2012-01-25 LAB — GC/CHLAMYDIA PROBE AMP
CT Probe RNA: NEGATIVE
GC Probe RNA: NEGATIVE

## 2012-01-25 LAB — HEPATITIS PANEL, ACUTE
Hep B C IgM: NEGATIVE
Hepatitis B Surface Ag: NEGATIVE

## 2012-01-25 LAB — RPR

## 2012-01-25 LAB — HIV ANTIBODY (ROUTINE TESTING W REFLEX): HIV: NONREACTIVE

## 2012-02-15 ENCOUNTER — Ambulatory Visit (INDEPENDENT_AMBULATORY_CARE_PROVIDER_SITE_OTHER): Payer: BC Managed Care – PPO | Admitting: Psychology

## 2012-02-15 DIAGNOSIS — F4323 Adjustment disorder with mixed anxiety and depressed mood: Secondary | ICD-10-CM

## 2012-02-22 ENCOUNTER — Ambulatory Visit: Payer: BC Managed Care – PPO | Admitting: Family Medicine

## 2012-03-07 ENCOUNTER — Ambulatory Visit: Payer: BC Managed Care – PPO | Admitting: Psychology

## 2012-03-07 ENCOUNTER — Ambulatory Visit (INDEPENDENT_AMBULATORY_CARE_PROVIDER_SITE_OTHER): Payer: BC Managed Care – PPO | Admitting: Family Medicine

## 2012-03-07 ENCOUNTER — Encounter: Payer: Self-pay | Admitting: Family Medicine

## 2012-03-07 VITALS — BP 120/74 | HR 79 | Temp 98.3°F | Ht 66.0 in | Wt 211.5 lb

## 2012-03-07 DIAGNOSIS — F411 Generalized anxiety disorder: Secondary | ICD-10-CM

## 2012-03-07 DIAGNOSIS — F329 Major depressive disorder, single episode, unspecified: Secondary | ICD-10-CM

## 2012-03-07 NOTE — Progress Notes (Signed)
Nature conservation officer at [facility] [address] [place] [address] Phone: [phone] Fax: [phone]  Date:  03/07/2012   Name:  Cheyenne Rivera   DOB:  1980-12-12   MRN:  [id number] Gender: female Age: 32 y.o.  Primary Physician:  Hannah Beat, MD  Evaluating MD: Hannah Beat, MD   Chief Complaint: Follow-up and mood   History of Present Illness:  Cheyenne Rivera is a 32 y.o. pleasant patient who presents with the following:  Ms. Drabik presents today for f/u of her depression and anxiety. She reports improved mood and lessened anxiety.  She has lost 4 lbs since her last visit and reports being more motivated to exercise and eat healthy.  Her appetite is still decreased, but she makes herself eat at least two meals and one snack a day.  She discontinued her Trazadone after the first dose because of drowsiness, and she was able to discontinue her Ativan after 3 days of use.    Doing hip hop abs. T25.  Moved out. Has a 33 year old with her partner, who had been cheating on her.   Going to counselling with Dr. Laymond Purser.  Patient Active Problem List  Diagnosis  . ANXIETY  . DEPRESSION  . SACROILIITIS, RIGHT  . BACK PAIN  . Family history of breast cancer  . Migraine headache  . Screen for STD (sexually transmitted disease)    Past Medical History  Diagnosis Date  . Anxiety   . Depression     Past Surgical History  Procedure Laterality Date  . Intrauterine device insertion  03/2007    Mirena  . Leep  3/03    History   Social History  . Marital Status: Married    Spouse Name: N/A    Number of Children: 2  . Years of Education: N/A   Occupational History  . dental assistant    Social History Main Topics  . Smoking status: Never Smoker   . Smokeless tobacco: Not on file  . Alcohol Use: Yes     Comment: rare  . Drug Use: No  . Sexually Active: Not on file   Other Topics Concern  . Not on file   Social History Narrative  . No  narrative on file    Family History  Problem Relation Age of Onset  . Alcohol abuse Father   . Colon cancer Neg Hx   . Ovarian cancer Neg Hx   . Breast cancer      Grandmother  . Lung cancer Neg Hx   . Prostate cancer Neg Hx   . Coronary artery disease Neg Hx   . Stroke Neg Hx   . Sudden death      >50, Grand father, ?cause  . Diabetes Neg Hx   . Mental illness Neg Hx     Allergies  Allergen Reactions  . Morphine     REACTION: rash and nausea  . Oxycodone-Acetaminophen     REACTION: rash    Medication list has been reviewed and updated.  Outpatient Prescriptions Prior to Visit  Medication Sig Dispense Refill  . promethazine (PHENERGAN) 25 MG tablet Take 25 mg by mouth every 6 (six) hours as needed.      . propranolol ER (INDERAL LA) 80 MG 24 hr capsule Take 1 capsule (80 mg total) by mouth daily.  30 capsule  2  . zolmitriptan (ZOMIG-ZMT) 5 MG disintegrating tablet Take 5 mg by mouth as needed. may repeat in 2  hours. Max 2/24 hours      . Venlafaxine HCl 37.5 MG TB24 1 tab po daily x 1 week, then if tolerating increase to 2 tabs po daily  60 each  3  . LORazepam (ATIVAN) 0.5 MG tablet Take 1 tablet (0.5 mg total) by mouth daily as needed.  30 tablet  0  . traZODone (DESYREL) 50 MG tablet Take 0.5-1 tablets (25-50 mg total) by mouth at bedtime as needed for sleep.  30 tablet  3   No facility-administered medications prior to visit.    Review of Systems:   GEN: also with URI sx GI: No n/v/d, eating normally Pulm: No SOB Interactive and getting along well at home.  Otherwise, ROS is as per the HPI.    Physical Examination: BP 120/74  Pulse 79  Temp(Src) 98.3 F (36.8 C) (Oral)  Ht 5\' 6"  (1.676 m)  Wt 211 lb 8 oz (95.936 kg)  BMI 34.15 kg/m2  SpO2 98%  Ideal Body Weight: Weight in (lb) to have BMI = 25: 154.6   Gen: WDWN, NAD; A & O x3, cooperative. Pleasant.Globally Non-toxic HEENT: Normocephalic and atraumatic. Throat clear, w/o exudate, R TM clear, L  TM - good landmarks, No fluid present. rhinnorhea.  MMM Frontal sinuses: NT Max sinuses: NT NECK: Anterior cervical  LAD is absent CV: RRR, No M/G/R, cap refill <2 sec PULM: Breathing comfortably in no respiratory distress. no wheezing, crackles, rhonchi EXT: No c/c/e PSYCH: Friendly, good eye contact MSK: Nml gait  Assessment and Plan:  DEPRESSION  ANXIETY  Stable and doing much better Reassured her about her cold sx  Orders Today:  No orders of the defined types were placed in this encounter.    Updated Medication List: (Includes new medications, updates to list, dose adjustments) Meds ordered this encounter  Medications  . Venlafaxine HCl 37.5 MG TB24    Sig: 1 tab po daily    Medications Discontinued: Medications Discontinued During This Encounter  Medication Reason  . traZODone (DESYREL) 50 MG tablet Error  . LORazepam (ATIVAN) 0.5 MG tablet Error  . Venlafaxine HCl 37.5 MG TB24       Signed, Spencer T. Copland, MD 03/07/2012 8:31 AM

## 2012-03-23 LAB — COMPREHENSIVE METABOLIC PANEL
Alkaline Phosphatase: 65 U/L (ref 50–136)
Anion Gap: 7 (ref 7–16)
BUN: 8 mg/dL (ref 7–18)
Bilirubin,Total: 0.8 mg/dL (ref 0.2–1.0)
Co2: 26 mmol/L (ref 21–32)
Creatinine: 0.71 mg/dL (ref 0.60–1.30)
EGFR (African American): >60
Potassium: 3.5 mmol/L (ref 3.5–5.1)
SGOT(AST): 71 U/L — ABNORMAL HIGH (ref 15–37)
SGPT (ALT): 60 U/L (ref 12–78)

## 2012-03-23 LAB — URINALYSIS, COMPLETE
Bacteria: NONE SEEN
Blood: NEGATIVE
Glucose,UR: NEGATIVE mg/dL (ref 0–75)
Leukocyte Esterase: NEGATIVE
Nitrite: NEGATIVE
Ph: 5 (ref 4.5–8.0)
Specific Gravity: 1.024 (ref 1.003–1.030)
Squamous Epithelial: 2
WBC UR: 1 /HPF (ref 0–5)

## 2012-03-23 LAB — CBC
HCT: 40.2 % (ref 35.0–47.0)
HGB: 13.1 g/dL (ref 12.0–16.0)
MCHC: 32.5 g/dL (ref 32.0–36.0)
MCV: 91 fL (ref 80–100)
Platelet: 295 10*3/uL (ref 150–440)
RBC: 4.42 10*6/uL (ref 3.80–5.20)

## 2012-03-24 ENCOUNTER — Observation Stay: Payer: Self-pay | Admitting: Surgery

## 2012-03-24 LAB — COMPREHENSIVE METABOLIC PANEL
Albumin: 3.1 g/dL — ABNORMAL LOW (ref 3.4–5.0)
Alkaline Phosphatase: 80 U/L (ref 50–136)
Anion Gap: 3 — ABNORMAL LOW (ref 7–16)
BUN: 9 mg/dL (ref 7–18)
Bilirubin,Total: 0.7 mg/dL (ref 0.2–1.0)
Co2: 29 mmol/L (ref 21–32)
Creatinine: 0.7 mg/dL (ref 0.60–1.30)
EGFR (Non-African Amer.): >60
SGPT (ALT): 313 U/L — ABNORMAL HIGH (ref 12–78)
Total Protein: 6.1 g/dL — ABNORMAL LOW (ref 6.4–8.2)

## 2012-03-24 LAB — CBC WITH DIFFERENTIAL/PLATELET
HCT: 35.6 % (ref 35.0–47.0)
MCHC: 34 g/dL (ref 32.0–36.0)
MCV: 92 fL (ref 80–100)
Monocyte #: 0.6 x10 3/mm (ref 0.2–0.9)
Monocyte %: 5.8 %
Platelet: 232 10*3/uL (ref 150–440)

## 2012-03-27 LAB — PATHOLOGY REPORT

## 2012-09-18 ENCOUNTER — Encounter: Payer: Self-pay | Admitting: Family Medicine

## 2012-09-18 ENCOUNTER — Ambulatory Visit (INDEPENDENT_AMBULATORY_CARE_PROVIDER_SITE_OTHER): Payer: BC Managed Care – PPO | Admitting: Family Medicine

## 2012-09-18 VITALS — BP 112/78 | HR 86 | Temp 98.1°F | Wt 209.5 lb

## 2012-09-18 DIAGNOSIS — G43909 Migraine, unspecified, not intractable, without status migrainosus: Secondary | ICD-10-CM

## 2012-09-18 MED ORDER — PROMETHAZINE HCL 25 MG PO TABS: 25.0000 mg | ORAL_TABLET | Freq: Four times a day (QID) | ORAL | Status: DC | PRN

## 2012-09-18 MED ORDER — RIZATRIPTAN BENZOATE 10 MG PO TABS: 10.0000 mg | ORAL_TABLET | ORAL | Status: DC | PRN

## 2012-09-18 MED ORDER — PROPRANOLOL HCL ER 80 MG PO CP24: 80.0000 mg | ORAL_CAPSULE | Freq: Every day | ORAL | Status: DC

## 2012-09-18 NOTE — Progress Notes (Signed)
Off HA meds for a few months.  HA worse in the meantime. Aura with HA.  Light and sound bother her.  Nausea, vomiting.  Throbbing HA, behind the eyes.  Likely longstanding h/o migraines.  Prev lack of effect with imitrex.  zomig helped.   Meds, vitals, and allergies reviewed.   ROS: See HPI.  Otherwise, noncontributory.  CN 2-12 wnl B, S/S/DTR wnl x4 GEN: nad, alert and oriented HEENT: mucous membranes moist NECK: supple w/o LA CV: rrr PULM: ctab, no inc wob ABD: soft, +bs EXT: no edema

## 2012-09-18 NOTE — Patient Instructions (Addendum)
Start back on propranolol.   Take phenergan for nausea.  Use the maxalt for the migraine.  Give [place] an update in a day or two.  Take care.

## 2012-09-19 NOTE — Assessment & Plan Note (Signed)
I called pharmacy.  We can't get zomig today.  Would try maxalt as she didn't have effect with imitrex.  She agrees with plan.  Restart other meds and f/u with PCP prn.  She agrees.

## 2014-04-29 NOTE — H&P (Signed)
PATIENT NAME:  Cheyenne Rivera, SEVILLANO MR#:  [id number] DATE OF BIRTH:  [birth year]  DATE OF ADMISSION:  08/19/2011  PRIMARY CARE PHYSICIAN: Hannah Beat, MD at [facility]   CHIEF COMPLAINT: Headache and nausea today.   HISTORY OF PRESENT ILLNESS: Ms. Cheyenne Rivera is a 34 year old Caucasian female with no significant past medical history, comes to the Emergency Room accompanied by her mother with the above-mentioned chief complaint. The patient reports she started having a headache, feeling pressure-like pain behind her eyeballs bilaterally, more on the left than the right, associated with blurred vision and left arm numbness since last night. She went to bed, woke up this morning and felt a little better after she had taken Tylenol. She went to work. She works as a Sales executive. She had a light day at work, was not feeling well, came home, took some Tylenol for her ongoing symptoms of headache, more on the left than the right, and called the triage nurse at [facility] and was asked to come to the Emergency Room. Next, in the Emergency Room, the patient received IV Zofran, Reglan, Benadryl, dexamethasone, Dilaudid, and Toradol. Her headache seems to be not improved much. She continues to have some blurred vision in her left eye more than right along with some left arm numbness. She is being admitted for suspected migraine headache, new onset, with symptoms mimicking stroke. CT of the head was negative. Her vitals in the Emergency Room have been stable.   PAST MEDICAL HISTORY: None.    PAST SURGICAL HISTORY: She had a tonsillectomy.   ALLERGIES: Morphine causes rash.   FAMILY HISTORY: Sister was diagnosed with migraine headaches at age 68. Maternal aunt was diagnosed with migraine headaches at age 56 years. Her aunt used to present with symptoms of syncope at that time during the initial presentation.  REVIEW OF SYSTEMS: CONSTITUTIONAL: No fever, fatigue, or weakness. EYES:  Positive for some blurred vision in the left eye. No glaucoma or cataracts. ENT: No tinnitus, ear pain, hearing loss. RESPIRATORY: No cough, wheeze or hemoptysis. CARDIOVASCULAR: No chest pain, orthopnea, edema. GASTROINTESTINAL: Positive for nausea. No abdominal pain, vomiting, diarrhea, or gastroesophageal reflux disease. GENITOURINARY: No dysuria or hematuria. ENDOCRINE: No polyuria or nocturia. HEMATOLOGY: No anemia or easy bruising. SKIN: No acne or rash. MUSCULOSKELETAL: No arthritis. NEUROLOGIC: Positive for left arm numbness and headache. PSYCHIATRIC: No anxiety or depression. All other systems reviewed and negative.   PHYSICAL EXAMINATION:  GENERAL: The patient is awake, alert, oriented x3, not in acute distress.   VITAL SIGNS: Afebrile, pulse is 79, blood pressure is 126/76, saturations are 99% on room air.   HEENT: Atraumatic, normocephalic. Pupils are equal, round, and reactive to light and accommodation. Extraocular movements intact. Oral mucosa is moist.  NECK: Supple, no JVD, no carotid bruit.   LUNGS: Clear to auscultation bilaterally. No rales, rhonchi, respiratory distress or labored breathing.   HEART: Both the heart sounds are normal rate and rhythm is regular. PMI not lateralized. Chest nontender. Good pedal pulses.  Good femoral pulses. No lower extremity edema.  ABDOMEN: Soft, benign, and nontender. No organomegaly. Positive bowel sounds.   NEUROLOGIC: The patient is right-handed. Speech is clear. No nystagmus. Pupils are bilaterally equally and reacting to light. Grossly, cranial nerves appear intact. Motor exam: Grade 5 out of 5 strength in all four extremities. The patient has subjective numbness in her left upper extremity. Reflexes are 1+ in both upper and lower extremities. Plantars are downgoing.   SKIN: Warm and  dry.   PSYCHIATRIC: The patient is awake, alert, oriented x3.   LABORATORY, DIAGNOSTIC AND RADIOLOGICAL DATA: CT of the head is negative for acute  cerebrovascular accident. Her white count is 13,000. The rest of the CBC and comprehensive metabolic panel, along with cardiac enzymes and urinalysis are negative. TSH is 3.06. Pregnancy test is negative.    ASSESSMENT AND PLAN: Cheyenne Rivera is a 34 year old with: 1. Suspected migraine headache, new onset: The patient presented with severe headache with pain behind the eyes, more on the left than the right associated with left arm numbness and left eye blurriness with nausea. She has strong family history in an older sister at age 35 and maternal aunt at age 35. The patient received IV Dilaudid, Toradol, Reglan, dexamethasone, Zofran and continues to have headache and numbness. CT head is negative. We will admit the patient to the Medical floor for overnight observation. Continue IV Dilaudid p.r.n. and Tylenol along with p.r.n. Zofran as needed. We will obtain Neurology consultation in the morning for further evaluation of the patient's new onset headache. Consider MRI of the brain if the patient's symptoms do not improve.  2. Nausea due to headache: We will continue p.r.n. Zofran.   The above was discussed with the patient and the patient's mother, who is present in the Emergency Room.   TIME SPENT: 45 minutes. ____________________________ Wylie HailSona A. Allena KatzPatel, MD sap:cbb D: 08/19/2011 00:06:50 ET T: 08/19/2011 07:39:50 ET JOB#: [id number]  cc: Priyah Schmuck A. Allena KatzPatel, MD, <Dictator> Hannah BeatSpencer Copland, MD Willow OraSONA A Stockton Nunley MD ELECTRONICALLY SIGNED 08/22/2011 16:06

## 2014-04-29 NOTE — Discharge Summary (Signed)
PATIENT NAME:  Cheyenne Rivera, Cheyenne Rivera MR#:  [id number] DATE OF BIRTH:  10/13/1980  DATE OF ADMBeverly GustSSION:  08/19/2011 DATE OF DISCHARGE:  08/20/2011  DIAGNOSES:  1. Headache, possibly migraine versus tension headache. 2. Nausea and vomiting, resolved. 3. Reactive leukocytosis.   CONSULTATION: Neurology consultation with Dr. Kemper Durielarke    RESULTS: MRI of the brain showed no acute abnormality. CT of the head showed no acute abnormalities. Urinalysis showed no evidence of infection. White count 13. Rest of CBC normal. CMP normal. TSH normal. Cardiac enzymes normal.   HOSPITAL COURSE: The patient is a 34 year old female with no significant past medical history who had a family history of migraine headaches presented with headache described as being behind the eye and also left upper extremity numbness. The patient was admitted to the hospital and started on p.r.n. antiemetics and analgesics with good control of her symptoms. Her blood work was essentially normal other than reactive leukocytosis. Her MRI of the brain was also normal. The patient was evaluated by Dr. Kemper Durielarke. Differential diagnosis included complicated migraine versus tension headache. She has been advised to follow-up with neurologist if she has recurrent episodes. She was tolerating an oral diet without any difficulty by the time of discharge and is being discharged home in a stable condition.   TIME SPENT: 45 minutes.   ____________________________ Darrick MeigsSangeeta Tiarna Koppen, MD sp:drc D: 08/20/2011 14:35:55 ET T: 08/22/2011 10:58:04 ET JOB#: [id number]  cc: Darrick MeigsSangeeta Halima Fogal, MD, <Dictator> Darrick MeigsSANGEETA Kyana Aicher MD ELECTRONICALLY SIGNED 08/22/2011 13:16

## 2014-05-02 NOTE — H&P (Signed)
PATIENT NAME:  Cheyenne Rivera, Cheyenne Rivera MR#:  [id number] DATE OF BIRTH:  07/15/1980  DATE OF ADMISSION:  03/24/2012  CHIEF COMPLAINT:  Right upper quadrant pain.   HISTORY OF PRESENT ILLNESS: This is a patient with right upper quadrant pain. It started after a fatty meal at 1300 or 1330 hours today. She has never had an episode like this before. She has had nausea but has not vomited. She has had no fevers or chills. No melena or hematochezia.  Has had some loose stools today and, again, has never had an episode like this before.   PAST MEDICAL HISTORY: Depression, which has resolved. She is no longer taking medications.   PAST SURGICAL HISTORY:  None.   ALLERGIES:  MORPHINE.   MEDICATIONS:  None.   FAMILY HISTORY:  Multiple family members with gallbladder disease who have had surgery.   SOCIAL HISTORY:  The patient does not smoke or drink.  REVIEW OF SYSTEMS:  10-system review is performed and negative with the exception of that mentioned in the HPI.  PHYSICAL EXAMINATION: GENERAL: Healthy-appearing Caucasian female patient.  VITAL SIGNS: Temperature 96.4, pulse 84, respirations 18, blood pressure 126/83, pain scale of 10, 96% room air sat.  HEENT:  Shows no scleral icterus.  NECK:  No palpable neck nodes.  CHEST:  Clear to auscultation.  CARDIAC:  Regular rate and rhythm.  ABDOMEN:  Soft. There is tenderness in the right upper quadrant without a mass, questionable minimal Murphy's sign.   EXTREMITIES:  Without edema.  NEUROLOGIC:  Grossly intact.  INTEGUMENT:  Shows no jaundice.   CT scan suggests acute cholecystitis with pericholecystic fluid and thickening in the gallbladder wall and stones. Liver function tests are only mildly elevated and the white blood cell count is elevated.   ASSESSMENT AND PLAN:  Symptomatic cholelithiasis, possibly acute cholecystitis. Recommend admission to the hospital, hydration and antibiotics and consider surgery in the next day or 2.  I discussed with  she and her family the rationale for this approach and the options of observation that were discharged because this is her first event; but because she has elevated white blood cell count and persistent tenderness and nausea, admission is in order.  I discussed with them the risks of bleeding, infection, recurrence of symptoms, failure to resolve her symptoms, conversion to an open procedure, bile duct damage, bile duct leak, bowel injury, any of which could require further surgery and/or ERCP, stent and papillotomy, also the risk of retained stone. They were in agreement with this plan. I also emphasized for them that coming into the weekend, this is not an emergency, per se, and depending on the scheduling of the operating room, the surgery may not be done on Saturday, but may have to be done later. They understood and agreed with this plan.       ____________________________ Adah Salvageichard E. Excell Seltzerooper, MD rec:ce D: 03/23/2012 23:34:37 ET T: 03/24/2012 10:42:33 ET JOB#: [id number]  cc: Adah Salvageichard E. Excell Seltzerooper, MD, <Dictator> Lattie HawICHARD E Dilcia Rybarczyk MD ELECTRONICALLY SIGNED 03/28/2012 12:28

## 2014-05-02 NOTE — Op Note (Signed)
patient NAME:  Cheyenne Rivera, Agnes C MR#:  [id number] DATE OF BIRTH:  [birth year]  DATE OF PROCEDURE:  03/24/2012  PREOPERATIVE DIAGNOSIS: Acute cholecystitis.   POSTOPERATIVE DIAGNOSIS: Acute cholecystitis.   PROCEDURE PERFORMED: Laparoscopic cholecystectomy.   ATTENDING SURGEON: Raynald KempMark A Hunter Bachar, M.D.   ASSISTANT: Surgical scrub technologist.   TYPE OF ANESTHESIA: General oral endotracheal, Dr. Darleene CleaverVan Staveren and associates.   FINDINGS: Acute cholecystitis.   SPECIMENS: Gallbladder with contents.   ESTIMATED BLOOD LOSS: 25 mL.   LAP AND NEEDLE COUNT: Correct x 2.   DESCRIPTION OF PROCEDURE: With the patient in the supine position, general endotracheal anesthesia was induced. Left arm was padded and tucked at her side.  Abdomen was widely prepped and draped with ChloraPrep solution. Timeout was observed.   A 12 mm blunt Hassan trocar was placed through an open technique through an infraumbilical transverse groin and skin incision with stay sutures being passed through the fascia. Pneumoperitoneum was established. A 5 mm bladeless trocar was placed in the epigastric region, two 5 mm first assistant ports in the right subcostal margin. The gallbladder was aspirated of approximately 30 mL of dark viscous bile, grasped along its fundus and elevated towards the right lobe of the liver. Lateral traction was applied to Hartman's pouch and filmy adhesions were taken down with blunt technique and point cautery. The hepatoduodenal ligament was then incised with blunt technique, exposing a cystic duct and cystic artery with critical view of safety cholecystectomy. The cystic duct was doubly clipped on the portal side, singly clipped on the gallbladder side and divided. The cystic artery was likewise doubly clipped on the portal side, singly clipped on the gallbladder side and divided. The gallbladder was then retrieved off the gallbladder fossa without difficulty utilizing hook cautery apparatus, placed into an  Endo Catch device and retrieved. The right upper quadrant was then irrigated with 1 liter of normal saline and aspirated dry. In the process of taking the gallbladder out, the 5 mm camera was used to look down into the pelvis. There was no evidence of bowel injury with trocar placement.   With hemostasis being ensured on the operative field prior to closure, the ports were then removed under direct visualization. Pneumoperitoneum was released and the infraumbilical fascial defect was closed with a figure-of-eight vertically oriented #0 Vicryl suture. A total of 30 mL of 0.25% plain Marcaine was infiltrated at the completion of the operation. Then 4-0 Vicryl subcuticular was applied to all skin edges followed by benzoin, Steri-Strips, Telfa and Tegaderm. The patient was then subsequently extubated and taken to the recovery room in stable and satisfactory condition by anesthesia services.     ____________________________ Redge GainerMark A. Egbert GaribaldiBird, MD mab:cs D: 03/24/2012 17:21:00 ET T: 03/25/2012 14:51:10 ET JOB#: [id number]  cc: Loraine LericheMark A. Egbert GaribaldiBird, MD, <Dictator> Raynald KempMARK A Karell Tukes MD ELECTRONICALLY SIGNED 03/27/2012 19:41

## 2014-05-02 NOTE — H&P (Signed)
Subjective/Chief Complaint RUQ pain   History of Present Illness first episode, RUQ and rt back pain nausea, started about 1330 after fatty meal no f/c   Past History depression, no longer on meds PSH none   Past Med/Surgical Hx:  Migraines:   denies:   LEEP:   Tonsillectomy:   broken back:   ALLERGIES:  Morphine: Unknown  Family and Social History:  Family History mult members with GB disease   Social History negative tobacco, negative ETOH   Place of Living Home   Review of Systems:  Fever/Chills No   Cough No   Abdominal Pain Yes   Diarrhea Yes   Constipation No   Nausea/Vomiting Yes   SOB/DOE No   Chest Pain No   Dysuria No   Tolerating Diet Yes  Nauseated   Physical Exam:  GEN no acute distress   HEENT pink conjunctivae   NECK supple   RESP normal resp effort  clear BS  no use of accessory muscles   CARD regular rate   ABD positive tenderness  soft  RUQ   LYMPH negative neck   EXTR negative edema   SKIN normal to palpation   PSYCH alert, A+O to time, place, person, good insight   Lab Results: Hepatic:  14-Mar-14 18:10   Bilirubin, Total 0.8  Alkaline Phosphatase 65  SGPT (ALT) 60  SGOT (AST)  71  Total Protein, Serum 7.5  Albumin, Serum 3.8  Routine Chem:  14-Mar-14 18:10   Glucose, Serum  129  BUN 8  Creatinine (comp) 0.71  Sodium, Serum 141  Potassium, Serum 3.5  Chloride, Serum  108  CO2, Serum 26  Calcium (Total), Serum  8.3  Osmolality (calc) 281  eGFR (African American) >60  eGFR (Non-African American) >60 (eGFR values <11m/min/1.73 m2 may be an indication of chronic kidney disease (CKD). Calculated eGFR is useful in patients with stable renal function. The eGFR calculation will not be reliable in acutely ill patients when serum creatinine is changing rapidly. It is not useful in  patients on dialysis. The eGFR calculation may not be applicable to patients at the low and high extremes of body sizes,  pregnant women, and vegetarians.)  Anion Gap 7  Lipase 166 (Result(s) reported on 23 Mar 2012 at 06:31PM.)  Routine UA:  14-Mar-14 18:10   Color (UA) Yellow  Clarity (UA) Hazy  Glucose (UA) Negative  Bilirubin (UA) Negative  Ketones (UA) Trace  Specific Gravity (UA) 1.024  Blood (UA) Negative  pH (UA) 5.0  Protein (UA) Negative  Nitrite (UA) Negative  Leukocyte Esterase (UA) Negative (Result(s) reported on 23 Mar 2012 at 06:30PM.)  RBC (UA) <1 /HPF  WBC (UA) 1 /HPF  Bacteria (UA) NONE SEEN  Epithelial Cells (UA) 2 /HPF  Mucous (UA) PRESENT (Result(s) reported on 23 Mar 2012 at 06:30PM.)  Routine Hem:  14-Mar-14 18:10   WBC (CBC)  20.4  RBC (CBC) 4.42  Hemoglobin (CBC) 13.1  Hematocrit (CBC) 40.2  Platelet Count (CBC) 295 (Result(s) reported on 23 Mar 2012 at 06:25PM.)  MCV 91  MCH 29.6  MCHC 32.5  RDW 12.8   Radiology Results: CT:    14-Mar-14 21:08, CT Abdomen and Pelvis With Contrast  CT Abdomen and Pelvis With Contrast  REASON FOR EXAM:    (1) RLQ pain and vomiting WBC 20; (2) RLQ pain and   vomiting WBC 20  COMMENTS:       PROCEDURE: CT  - CT ABDOMEN / PELVIS  W  -  Mar 23 2012  9:08PM     RESULT: Comparison:  None    Technique: Multiple axial images of the abdomen and pelvis were performed   from the lung bases to the pubic symphysis, without p.o. contrast and   with 100 mL of Isovue 370 intravenous contrast.    Findings:  Mild basilar opacities are likely secondary to atelectasis. Minimal   low-attenuation along the falciform ligament likely represents focal     fatty deposition. Small low-attenuation lesion in the right hepatic lobe   is too small to characterize. There is a small calcified stone within the   gallbladder. There is a suggestion of a trace amount of stranding   adjacent to the posterior aspect of the gallbladder. The liver, adrenals,   and pancreas are unremarkable. The kidneys enhance normally.    There is a small fat-containing  periumbilical hernia. The small and large   bowel are normal in caliber. An intrauterine contraceptive device is   present within the uterus. Relative posterior directed orientation of the   IUD may be related to a retroflexed uterus. There is a trace amount of   free fluid in the pelvis which is likely physiologic. The appendix is   normal.    No aggressive lytic or sclerotic osseous lesions are identified.  IMPRESSION:   1. Normal appendix.  2. Cholelithiasis. There is suggestion of a trace amount of   pericholecystic stranding along the posterior margin of the gallbladder.   Correlate for acute cholecystitis. Further evaluation could be provided   with right upper quadrant ultrasound, as indicated.      Dictation Site: 8        Verified By: Gregor Hams, M.D., MD    Assessment/Admission Diagnosis RUQ pain with gallstones, fluid suggests ac chole admit hydrate possible lap chole tomorrow risks options rev'd agrees with plan  see dictation   Electronic Signatures: Florene Glen (MD)  (Signed 14-Mar-14 23:30)  Authored: CHIEF COMPLAINT and HISTORY, PAST MEDICAL/SURGIAL HISTORY, ALLERGIES, FAMILY AND SOCIAL HISTORY, REVIEW OF SYSTEMS, PHYSICAL EXAM, LABS, Radiology, ASSESSMENT AND PLAN   Last Updated: 14-Mar-14 23:30 by Florene Glen (MD)

## 2014-08-04 ENCOUNTER — Encounter: Payer: Self-pay | Admitting: Family Medicine

## 2014-08-04 ENCOUNTER — Ambulatory Visit (INDEPENDENT_AMBULATORY_CARE_PROVIDER_SITE_OTHER): Payer: No Typology Code available for payment source | Admitting: Family Medicine

## 2014-08-04 VITALS — BP 114/70 | HR 94 | Temp 97.5°F | Ht 65.5 in | Wt 212.5 lb

## 2014-08-04 DIAGNOSIS — R5383 Other fatigue: Secondary | ICD-10-CM

## 2014-08-04 DIAGNOSIS — G43009 Migraine without aura, not intractable, without status migrainosus: Secondary | ICD-10-CM

## 2014-08-04 LAB — CBC WITH DIFFERENTIAL/PLATELET
BASOS ABS: 0 10*3/uL (ref 0.0–0.1)
BASOS PCT: 0.4 % (ref 0.0–3.0)
EOS PCT: 1.2 % (ref 0.0–5.0)
Eosinophils Absolute: 0.1 10*3/uL (ref 0.0–0.7)
HCT: 44.8 % (ref 36.0–46.0)
Hemoglobin: 15 g/dL (ref 12.0–15.0)
LYMPHS ABS: 2.6 10*3/uL (ref 0.7–4.0)
LYMPHS PCT: 22.2 % (ref 12.0–46.0)
MCHC: 33.5 g/dL (ref 30.0–36.0)
MCV: 91.6 fl (ref 78.0–100.0)
Monocytes Absolute: 0.7 10*3/uL (ref 0.1–1.0)
Monocytes Relative: 6 % (ref 3.0–12.0)
NEUTROS ABS: 8.3 10*3/uL — AB (ref 1.4–7.7)
Neutrophils Relative %: 70.2 % (ref 43.0–77.0)
PLATELETS: 259 10*3/uL (ref 150.0–400.0)
RBC: 4.9 Mil/uL (ref 3.87–5.11)
RDW: 12.5 % (ref 11.5–15.5)
WBC: 11.8 10*3/uL — AB (ref 4.0–10.5)

## 2014-08-04 LAB — BASIC METABOLIC PANEL
BUN: 8 mg/dL (ref 6–23)
CHLORIDE: 105 meq/L (ref 96–112)
CO2: 28 mEq/L (ref 19–32)
CREATININE: 0.63 mg/dL (ref 0.40–1.20)
Calcium: 9.7 mg/dL (ref 8.4–10.5)
GFR: 114.48 mL/min (ref >60.00)
Glucose, Bld: 102 mg/dL — ABNORMAL HIGH (ref 70–99)
Potassium: 4.5 mEq/L (ref 3.5–5.1)
Sodium: 139 mEq/L (ref 135–145)

## 2014-08-04 LAB — HEPATIC FUNCTION PANEL
ALT: 38 U/L — ABNORMAL HIGH (ref 0–35)
AST: 30 U/L (ref 0–37)
Albumin: 4.4 g/dL (ref 3.5–5.2)
Alkaline Phosphatase: 44 U/L (ref 39–117)
BILIRUBIN TOTAL: 0.3 mg/dL (ref 0.2–1.2)
Bilirubin, Direct: 0.1 mg/dL (ref 0.0–0.3)
Total Protein: 7.1 g/dL (ref 6.0–8.3)

## 2014-08-04 MED ORDER — RIZATRIPTAN BENZOATE 10 MG PO TABS: 10.0000 mg | ORAL_TABLET | ORAL | Status: DC | PRN

## 2014-08-04 NOTE — Progress Notes (Signed)
Pre visit review using our clinic review tool, if applicable. No additional management support is needed unless otherwise documented below in the visit note. 

## 2014-08-04 NOTE — Progress Notes (Signed)
Dr. Karleen Hampshire T. Obi Scrima, MD, [facility] [address] [place], [address] Phone: [phone] Fax: [phone]  08/04/2014  Patient: Cheyenne Rivera, MRN: [id number], DOB: 1980/04/13, 34 y.o.  Primary Physician:  Hannah Beat, MD  Chief Complaint: Medication Refill and Headache  Subjective:   Cheyenne Rivera is a 34 y.o. very pleasant female patient who presents with the following:  Migraines still bothering her a lot.  Has a headache for 8 or 9 days now. (non-migraine)  Now out of migraine meds. Needs refill and f/u CPX  Now working at [employer]. Less stressful.  9 miles from house.  Taking out NTI at night Jaws have been hurting.     Past Medical History, Surgical History, Social History, Family History, Problem List, Medications, and Allergies have been reviewed and updated if relevant.  Patient Active Problem List   Diagnosis Date Noted  . Migraine headache 08/22/2011  . Family history of breast cancer 06/09/2010  . SACROILIITIS, RIGHT 06/04/2009  . BACK PAIN 06/04/2009  . ANXIETY 05/05/2008  . DEPRESSION 05/05/2008    Past Medical History  Diagnosis Date  . Anxiety   . Depression   . Migraine     Past Surgical History  Procedure Laterality Date  . Intrauterine device insertion  03/2007    Mirena  . Leep  3/03  . Cholecystectomy    . Tonsillectomy      History   Social History  . Marital Status: Married    Spouse Name: N/A  . Number of Children: 2  . Years of Education: N/A   Occupational History  . dental assistant    Social History Main Topics  . Smoking status: Never Smoker   . Smokeless tobacco: Never Used  . Alcohol Use: 0.0 oz/week    0 Standard drinks or equivalent per week     Comment: rare  . Drug Use: No  . Sexual Activity: Not on file   Other Topics Concern  . Not on file   Social History Narrative    Family History  Problem Relation Age of Onset  . Alcohol abuse  Father   . Colon cancer Neg Hx   . Ovarian cancer Neg Hx   . Lung cancer Neg Hx   . Prostate cancer Neg Hx   . Coronary artery disease Neg Hx   . Stroke Neg Hx   . Diabetes Neg Hx   . Mental illness Neg Hx   . Breast cancer      Grandmother  . Sudden death      >50, Grand father, ?cause    Allergies  Allergen Reactions  . Codeine Other (See Comments)  . Morphine     REACTION: rash and nausea    Medication list reviewed and updated in full in  Link.   GEN: No acute illnesses, no fevers, chills. GI: No n/v/d, eating normally Pulm: No SOB Interactive and getting along well at home.  Otherwise, ROS is as per the HPI.  Objective:   BP 114/70 mmHg  Pulse 94  Temp(Src) 97.5 F (36.4 C) (Oral)  Ht 5' 5.5" (1.664 m)  Wt 212 lb 8 oz (96.389 kg)  BMI 34.81 kg/m2  GEN: WDWN, NAD, Non-toxic, A & O x 3 HEENT: Atraumatic, Normocephalic. Neck supple. No masses, No LAD. Ears and Nose: No external deformity. CV: RRR, No M/G/R. No JVD. No thrill. No extra heart sounds. PULM: CTA B, no wheezes, crackles,  rhonchi. No retractions. No resp. distress. No accessory muscle use. EXTR: No c/c/e NEURO Normal gait.  PSYCH: Normally interactive. Conversant. Not depressed or anxious appearing.  Calm demeanor.   Laboratory and Imaging Data:  Assessment and Plan:   Migraine without aura and without status migrainosus, not intractable  Other fatigue - Plan: CBC with Differential/Platelet, Basic metabolic panel, Hepatic function panel  Refill maxalt and f/u for full cpx when she can  Follow-up: No Follow-up on file.  New Prescriptions   No medications on file   Orders Placed This Encounter  Procedures  . CBC with Differential/Platelet  . Basic metabolic panel  . Hepatic function panel    Signed,  Karleen Hampshire T. May Ozment, MD   Patient's Medications  New Prescriptions   No medications on file  Previous Medications   PROMETHAZINE (PHENERGAN) 25 MG TABLET    Take 1  tablet (25 mg total) by mouth every 6 (six) hours as needed.  Modified Medications   Modified Medication Previous Medication   RIZATRIPTAN (MAXALT) 10 MG TABLET rizatriptan (MAXALT) 10 MG tablet      Take 1 tablet (10 mg total) by mouth as needed for migraine. May repeat in 2 hours if needed    Take 1 tablet (10 mg total) by mouth as needed for migraine. May repeat in 2 hours if needed  Discontinued Medications   PROPRANOLOL ER (INDERAL LA) 80 MG 24 HR CAPSULE    Take 1 capsule (80 mg total) by mouth daily.

## 2014-08-05 ENCOUNTER — Encounter: Payer: Self-pay | Admitting: Family Medicine

## 2014-08-05 ENCOUNTER — Encounter: Payer: Self-pay | Admitting: *Deleted

## 2014-08-28 ENCOUNTER — Encounter: Payer: Self-pay | Admitting: Family Medicine

## 2014-08-28 ENCOUNTER — Other Ambulatory Visit (HOSPITAL_COMMUNITY)
Admission: RE | Admit: 2014-08-28 | Discharge: 2014-08-28 | Disposition: A | Discharge status: Home / Self Care | Payer: No Typology Code available for payment source | Source: Ambulatory Visit | Attending: Family Medicine | Admitting: Family Medicine

## 2014-08-28 ENCOUNTER — Ambulatory Visit (INDEPENDENT_AMBULATORY_CARE_PROVIDER_SITE_OTHER): Payer: No Typology Code available for payment source | Admitting: Family Medicine

## 2014-08-28 VITALS — BP 120/70 | HR 81 | Temp 98.6°F | Ht 65.0 in | Wt 214.5 lb

## 2014-08-28 DIAGNOSIS — Z01419 Encounter for gynecological examination (general) (routine) without abnormal findings: Secondary | ICD-10-CM | POA: Insufficient documentation

## 2014-08-28 DIAGNOSIS — Z1151 Encounter for screening for human papillomavirus (HPV): Secondary | ICD-10-CM | POA: Diagnosis present

## 2014-08-28 DIAGNOSIS — Z124 Encounter for screening for malignant neoplasm of cervix: Secondary | ICD-10-CM

## 2014-08-28 DIAGNOSIS — Z13220 Encounter for screening for lipoid disorders: Secondary | ICD-10-CM

## 2014-08-28 DIAGNOSIS — Z0000 Encounter for general adult medical examination without abnormal findings: Secondary | ICD-10-CM

## 2014-08-28 DIAGNOSIS — R5383 Other fatigue: Secondary | ICD-10-CM

## 2014-08-28 MED ORDER — PROMETHAZINE HCL 25 MG PO TABS: 25.0000 mg | ORAL_TABLET | Freq: Four times a day (QID) | ORAL | Status: DC | PRN

## 2014-08-28 NOTE — Patient Instructions (Signed)
1. ADD Metamucil, Benefiber, or Citrucel.

## 2014-08-28 NOTE — Progress Notes (Signed)
_0  Subjective:   Cheyenne Rivera is a 34 y.o. pleasant patient who presents with the following:  Health Maintenance Summary Reviewed and updated, unless pt declines services.  Tobacco History Reviewed. Non-smoker Alcohol: No concerns, no excessive use Exercise Habits: Some activity, rec at least 30 mins 5 times a week STD concerns: none Drug Use: None Birth control method: Mirena for now - had 7 years. [place] if gets pregnant Menses regular: yes Lumps or breast concerns: no Breast Cancer Family History: no  Health Maintenance  Topic Date Due  . TETANUS/TDAP  11/14/1998  . INFLUENZA VACCINE  08/11/2014  . HIV Screening  Completed   Thyroid nodule or abnormality  Has a mirena - has had it 7 years.    There is no immunization history on file for this patient. Patient Active Problem List   Diagnosis Date Noted  . Migraine headache 08/22/2011  . Family history of breast cancer 06/09/2010  . SACROILIITIS, RIGHT 06/04/2009  . BACK PAIN 06/04/2009  . ANXIETY 05/05/2008  . DEPRESSION 05/05/2008   Past Medical History  Diagnosis Date  . Anxiety   . Depression   . Migraine    Past Surgical History  Procedure Laterality Date  . Intrauterine device insertion  03/2007    Mirena  . Leep  3/03  . Cholecystectomy    . Tonsillectomy     Social History   Social History  . Marital Status: Married    Spouse Name: N/A  . Number of Children: 2  . Years of Education: N/A   Occupational History  . dental assistant    Social History Main Topics  . Smoking status: Never Smoker   . Smokeless tobacco: Never Used  . Alcohol Use: 0.0 oz/week    0 Standard drinks or equivalent per week     Comment: rare  . Drug Use: No  . Sexual Activity: Not on file   Other Topics Concern  . Not on file   Social History Narrative   Family History  Problem Relation Age of Onset  . Alcohol abuse Father   . Colon cancer Neg Hx   . Ovarian cancer Neg Hx   . Lung cancer Neg Hx   .  Prostate cancer Neg Hx   . Coronary artery disease Neg Hx   . Stroke Neg Hx   . Diabetes Neg Hx   . Mental illness Neg Hx   . Breast cancer      Grandmother  . Sudden death      >50, [place] father, ?cause   Allergies  Allergen Reactions  . Codeine Other (See Comments)  . Morphine     REACTION: rash and nausea    Medication list has been reviewed and updated.   General: Denies fever, chills, sweats. No significant weight loss. Eyes: Denies blurring,significant itching ENT: Denies earache, sore throat, and hoarseness.  Cardiovascular: Denies chest pains, palpitations, dyspnea on exertion,  Respiratory: Denies cough, dyspnea at rest,wheeezing Breast: no concerns about lumps GI: Denies nausea, vomiting, diarrhea, constipation, change in bowel habits, abdominal pain, melena, hematochezia GU: Denies dysuria, hematuria, urinary hesitancy, nocturia, denies STD risk, no concerns about discharge Musculoskeletal: Denies back pain, joint pain Derm: Denies rash, itching Neuro: Denies  paresthesias, frequent falls, frequent headaches Psych: Denies depression, anxiety Endocrine: Denies cold intolerance, heat intolerance, polydipsia Heme: Denies enlarged lymph nodes Allergy: No hayfever  Objective:   BP 120/70 mmHg  Pulse 81  Temp(Src) 98.6 F (37 C) (Oral)  Ht _1  (1.651  m)  Wt 214 lb 8 oz (97.297 kg)  BMI 35.69 kg/m2 No exam data present  GEN: well developed, well nourished, no acute distress Eyes: conjunctiva and lids normal, PERRLA, EOMI ENT: TM clear, nares clear, oral exam WNL Neck: supple, no lymphadenopathy, no thyromegaly, no JVD Pulm: clear to auscultation and percussion, respiratory effort normal CV: regular rate and rhythm, S1-S2, no murmur, rub or gallop, no bruits Chest: no scars, masses, no lumps BREAST: no lumps, no axillary LAD, no nipple discharge This portion of the physical examination was chaperoned by Hedy Camara, CMA.  GI: soft, non-tender; no  hepatosplenomegaly, masses; active bowel sounds all quadrants GU: Normal external female genitalia. Cervix appears intact without lesions or irritation. Vaginal canal normal without ulceration or lesion. Cervix NT to exam. Ovaries neither enlarged nor tender. (Chaperoned examination by female staff) Lymph: no cervical, axillary or inguinal adenopathy MSK: gait normal, muscle tone and strength WNL, no joint swelling, effusions, discoloration, crepitus  SKIN: clear, good turgor, color WNL, no rashes, lesions, or ulcerations Neuro: normal mental status, normal strength, sensation, and motion Psych: alert; oriented to person, place and time, normally interactive and not anxious or depressed in appearance.  All labs reviewed with patient. Lipids:    Component Value Date/Time   CHOL 127 06/04/2010 0851   TRIG 46.0 06/04/2010 0851   HDL 49.50 06/04/2010 0851   LDLDIRECT 72.7 06/04/2009 1626   VLDL 9.2 06/04/2010 0851   CHOLHDL 3 06/04/2010 0851   CBC: CBC Latest Ref Rng 08/04/2014 03/24/2012 03/23/2012  WBC 4.0 - 10.5 K/uL 11.8(H) 10.3 20.4(H)  Hemoglobin 12.0 - 15.0 g/dL 15.0 12.1 13.1  Hematocrit 36.0 - 46.0 % 44.8 35.6 40.2  Platelets 150.0 - 400.0 K/uL 259.0 232 027    Basic Metabolic Panel:    Component Value Date/Time   NA 139 08/04/2014 1214   NA 142 03/24/2012 0545   K 4.5 08/04/2014 1214   K 3.5 03/24/2012 0545   CL 105 08/04/2014 1214   CL 110* 03/24/2012 0545   CO2 28 08/04/2014 1214   CO2 29 03/24/2012 0545   BUN 8 08/04/2014 1214   BUN 9 03/24/2012 0545   CREATININE 0.63 08/04/2014 1214   CREATININE 0.70 03/24/2012 0545   GLUCOSE 102* 08/04/2014 1214   GLUCOSE 108* 03/24/2012 0545   CALCIUM 9.7 08/04/2014 1214   CALCIUM 8.0* 03/24/2012 0545   Hepatic Function Latest Ref Rng 08/04/2014 03/24/2012 03/23/2012  Total Protein 6.0 - 8.3 g/dL 7.1 6.1(L) 7.5  Albumin 3.5 - 5.2 g/dL 4.4 3.1(L) 3.8  AST 0 - 37 U/L 30 255(H) 71(H)  ALT 0 - 35 U/L 38(H) 313(H) 60  Alk  Phosphatase 39 - 117 U/L 44 80 65  Total Bilirubin 0.2 - 1.2 mg/dL 0.3 0.7 0.8  Bilirubin, Direct 0.0 - 0.3 mg/dL 0.1 - -    Lab Results  Component Value Date   TSH 2.48 06/04/2010   No results found.  Assessment and Plan:   Healthcare maintenance  Screening, lipid - Plan: Lipid panel  Other fatigue - Plan: TSH  Cervical cancer screening - Plan: Cytology - PAP Melvina  Health Maintenance Exam: The patient's preventative maintenance and recommended screening tests for an annual wellness exam were reviewed in full today. Brought up to date unless services declined.  Counselled on the importance of diet, exercise, and its role in overall health and mortality. The patient's FH and SH was reviewed, including their home life, tobacco status, and drug and alcohol status.  No  thyroid symptoms, no gland felt on exam. Check TSH. Discussed birth control methods, too, now she will think on it - she understands that she has a mirena in place x 7 years. Noted that if she would like, I could likely find someone to remove it.   Follow-up: No Follow-up on file. Or follow-up in 1 year at [facility].  New Prescriptions   No medications on file   Orders Placed This Encounter  Procedures  . Lipid panel  . TSH    Signed,  Frederico Hamman T. Lillis Nuttle, MD   Patient's Medications  New Prescriptions   No medications on file  Previous Medications   RIZATRIPTAN (MAXALT) 10 MG TABLET    Take 1 tablet (10 mg total) by mouth as needed for migraine. May repeat in 2 hours if needed  Modified Medications   Modified Medication Previous Medication   PROMETHAZINE (PHENERGAN) 25 MG TABLET promethazine (PHENERGAN) 25 MG tablet      Take 1 tablet (25 mg total) by mouth every 6 (six) hours as needed.    Take 1 tablet (25 mg total) by mouth every 6 (six) hours as needed.  Discontinued Medications   No medications on file

## 2014-08-28 NOTE — Progress Notes (Signed)
Pre visit review using our clinic review tool, if applicable. No additional management support is needed unless otherwise documented below in the visit note. 

## 2014-08-29 LAB — LIPID PANEL
CHOLESTEROL: 134 mg/dL (ref 0–200)
HDL: 43 mg/dL (ref >39.00)
LDL CALC: 63 mg/dL (ref 0–99)
NonHDL: 90.88
TRIGLYCERIDES: 138 mg/dL (ref 0.0–149.0)
Total CHOL/HDL Ratio: 3
VLDL: 27.6 mg/dL (ref 0.0–40.0)

## 2014-08-29 LAB — TSH: TSH: 1.54 u[IU]/mL (ref 0.35–4.50)

## 2014-09-02 LAB — CYTOLOGY - PAP

## 2014-10-24 ENCOUNTER — Encounter: Payer: Self-pay | Admitting: Obstetrics and Gynecology

## 2014-10-24 ENCOUNTER — Ambulatory Visit (INDEPENDENT_AMBULATORY_CARE_PROVIDER_SITE_OTHER): Payer: No Typology Code available for payment source | Admitting: Obstetrics and Gynecology

## 2014-10-24 VITALS — BP 123/86 | HR 79 | Wt 219.4 lb

## 2014-10-24 DIAGNOSIS — E669 Obesity, unspecified: Secondary | ICD-10-CM

## 2014-10-24 DIAGNOSIS — Z30430 Encounter for insertion of intrauterine contraceptive device: Secondary | ICD-10-CM | POA: Diagnosis not present

## 2014-10-24 MED ORDER — CYANOCOBALAMIN 1000 MCG/ML IJ SOLN: 1000.0000 ug | Freq: Once | INTRAMUSCULAR | Status: DC

## 2014-10-24 MED ORDER — PHENTERMINE HCL 37.5 MG PO TABS: 37.5000 mg | ORAL_TABLET | Freq: Every day | ORAL | Status: DC

## 2014-10-24 MED ORDER — LEVONORGESTREL 20 MCG/24HR IU IUD: 1.0000 | INTRAUTERINE_SYSTEM | Freq: Once | INTRAUTERINE | Status: DC

## 2014-10-24 NOTE — Patient Instructions (Signed)

## 2014-10-24 NOTE — Progress Notes (Signed)
Beverly GustDevan C Silva is a 34 y.o. year old No obstetric history on file. Caucasian female who presents for removal and replacement of a Mirena IUD. She was given informed consent for removal and reinsertion of her Mirena. Her Mirena was placed 2010, No LMP recorded. Patient is not currently having periods (Reason: IUD)., and her pregnancy test today was negative.   The risks and benefits of the method and placement have been thouroughly reviewed with the patient and all questions were answered.  Specifically the patient is aware of failure rate of 01/998, expulsion of the IUD and of possible perforation.  The patient is aware of irregular bleeding due to the method and understands the incidence of irregular bleeding diminishes with time.  Signed copy of informed consent in chart.   No LMP recorded. Patient is not currently having periods (Reason: IUD). BP 123/86 mmHg  Pulse 79  Wt 219 lb 6.4 oz (99.519 kg) No results found for this or any previous visit (from the past 24 hour(s)).   Appropriate time out taken. A graves speculum was placed in the vagina.  The cervix was visualized, prepped using Betadine. The strings were visible. They were grasped and the Mirena was easily removed. The cervix was then grasped with a single-tooth tenaculum. The uterus was found to be retroflexed and it sounded to 8 cm.  Mirena IUD placed per manufacturer's recommendations without complications. The strings were trimmed to 3 cm.  The patient tolerated the procedure well.  The patient was given post procedure instructions, including signs and symptoms of infection and to check for the strings after each menses or each month, and refraining from intercourse or anything in the vagina for 3 days.  She was given a Mirena care card with date Mirena placed, and date Mirena to be removed.    Melody Elissa LovettN Burr, CNM  Subjective:  Beverly GustDevan C Bolar is a 34 y.o. No obstetric history on file. at Unknown being seen today for weight loss  management- initial visit.  Patient reports General ROS: negative and reports previous weight loss attempts:sucessful at times   The following portions of the patient's history were reviewed and updated as appropriate: allergies, current medications, past family history, past medical history, past social history, past surgical history and problem list.   Objective:   Filed Vitals:   10/24/14 1700  BP: 123/86  Pulse: 79  Weight: 219 lb 6.4 oz (99.519 kg)    General:  Alert, oriented and cooperative. Patient is in no acute distress.  :   :   :   :   :   :   PE: Well groomed female in no current distress,   Mental Status: Normal mood and affect. Normal behavior. Normal judgment and thought content.   Current BMI: Body mass index is 36.51 kg/(m^2).   Assessment and Plan:  Obesity  1. Encounter for insertion of mirena IUD  2. Obesity Plan: low carb, High protein diet RX for adipex 37.5 mg daily and B12 1000mcg.ml monthly, to start now with first injection given at today's visit. Reviewed side-effects common to both medications and expected outcomes. Increase daily water intake to at least 8 bottle a day, every day.  Goal is to reduse weight by 10% by end of three months, and will re-evaluate then.  RTC in 4 weeks for Nurse visit to check weight & BP, and get next B12 injections.    Please refer to After Visit Summary for other counseling recommendations.  Melody Elissa Lovett, CNM   Melody Elissa Lovett, CNM      Consider the Low Glycemic Index Diet and 6 smaller meals daily .  This boosts your metabolism and regulates your sugars:   Use the protein bar by [employer] because they have lots of fiber in them  Find the low carb flatbreads, tortillas and pita breads for sandwiches:  Joseph's makes a pita bread and a flat bread , available at [facility] and [employer]; Toufayah makes a low carb flatbread available at [employer] and [employer] that is 9 net carbs and 100 cal [place] makes a low  carb whole wheat tortilla available at [employer] most grocery stores with 6 net carbs and 210 cal  [place] yogurt can still have a lot of carbs .  Dannon Light N fit has 80 cal and 8 carbs

## 2014-11-21 ENCOUNTER — Ambulatory Visit (INDEPENDENT_AMBULATORY_CARE_PROVIDER_SITE_OTHER): Payer: No Typology Code available for payment source | Admitting: Obstetrics and Gynecology

## 2014-11-21 VITALS — BP 115/76 | HR 89 | Ht 65.5 in | Wt 212.4 lb

## 2014-11-21 DIAGNOSIS — E669 Obesity, unspecified: Secondary | ICD-10-CM

## 2014-11-21 MED ORDER — CYANOCOBALAMIN 1000 MCG/ML IJ SOLN
1000.0000 ug | Freq: Once | INTRAMUSCULAR | Status: AC
  Administered 2014-11-21: 1000 ug via INTRAMUSCULAR

## 2014-11-21 NOTE — Progress Notes (Cosign Needed)
Patient ID: Cheyenne Rivera, female   DOB: 01/21/1980, 34 y.o.   MRN: [id number]  Pt presents for wt, bp and b12 inj. Pt c/o of constipation and dry mouth. Advised to increase h20 and use miralax. Weight is down 7#. Encouraged to eat healthy and exercise.

## 2014-12-15 ENCOUNTER — Telehealth: Payer: Self-pay | Admitting: *Deleted

## 2014-12-15 NOTE — Telephone Encounter (Signed)
Pt called back - she got her flu shot at work for no charge

## 2014-12-15 NOTE — Telephone Encounter (Signed)
Left message for Caterin to call our office to schedule flu vaccine or let us know if she has had once done somewhere else.

## 2014-12-16 ENCOUNTER — Encounter: Payer: Self-pay | Admitting: *Deleted

## 2014-12-16 NOTE — Telephone Encounter (Signed)
Left message for Cheyenne Rivera to call the office or send a MyChart message with the date she got her flu shot so I can update her record.

## 2014-12-19 ENCOUNTER — Ambulatory Visit (INDEPENDENT_AMBULATORY_CARE_PROVIDER_SITE_OTHER): Payer: Managed Care, Other (non HMO) | Admitting: Obstetrics and Gynecology

## 2014-12-19 VITALS — BP 119/84 | HR 111 | Ht 65.0 in | Wt 204.5 lb

## 2014-12-19 DIAGNOSIS — E669 Obesity, unspecified: Secondary | ICD-10-CM

## 2014-12-19 MED ORDER — CYANOCOBALAMIN 1000 MCG/ML IJ SOLN
1000.0000 ug | Freq: Once | INTRAMUSCULAR | Status: AC
  Administered 2014-12-19: 1000 ug via INTRAMUSCULAR

## 2014-12-19 NOTE — Patient Instructions (Signed)
FU in 1 month

## 2014-12-19 NOTE — Progress Notes (Cosign Needed)
Pt presents for weight management visit. This is pt's second visit in 3 month management. Pt notes only side effect is dry mouth which she can tolerate. Pt's weight today is 204lbs, pervious weight 212lbs. Pt is happy with weight loss thus far. Denies any further questions or concerns. Pt give B12 injection of 1mL. Pt  tolerated well. To f/u in 1 month.

## 2015-01-16 ENCOUNTER — Ambulatory Visit (INDEPENDENT_AMBULATORY_CARE_PROVIDER_SITE_OTHER): Payer: Managed Care, Other (non HMO) | Admitting: Obstetrics and Gynecology

## 2015-01-16 ENCOUNTER — Ambulatory Visit: Payer: Managed Care, Other (non HMO)

## 2015-01-16 VITALS — BP 120/85 | HR 82 | Ht 65.0 in | Wt 196.2 lb

## 2015-01-16 DIAGNOSIS — E669 Obesity, unspecified: Secondary | ICD-10-CM | POA: Diagnosis not present

## 2015-01-16 MED ORDER — CYANOCOBALAMIN 1000 MCG/ML IJ SOLN
1000.0000 ug | Freq: Once | INTRAMUSCULAR | Status: AC
  Administered 2015-01-16: 1000 ug via INTRAMUSCULAR

## 2015-01-16 NOTE — Progress Notes (Cosign Needed)
Patient ID: Cheyenne GustDevan C Rivera, female   DOB: 04/14/1980, 35 y.o.   MRN: [id number] Pt presents for weight, B/P, B-12 injection. No side effects of medication-Phentermine, or B-12.  Weight loss of 8 lbs. Encouraged eating healthy and exercise.

## 2015-02-13 ENCOUNTER — Ambulatory Visit (INDEPENDENT_AMBULATORY_CARE_PROVIDER_SITE_OTHER): Payer: Managed Care, Other (non HMO) | Admitting: Obstetrics and Gynecology

## 2015-02-13 VITALS — BP 119/76 | HR 93 | Ht 65.0 in | Wt 197.4 lb

## 2015-02-13 DIAGNOSIS — E669 Obesity, unspecified: Secondary | ICD-10-CM | POA: Diagnosis not present

## 2015-02-13 DIAGNOSIS — R5383 Other fatigue: Secondary | ICD-10-CM | POA: Diagnosis not present

## 2015-02-13 MED ORDER — CYANOCOBALAMIN 1000 MCG/ML IJ SOLN
1000.0000 ug | Freq: Once | INTRAMUSCULAR | Status: AC
  Administered 2015-02-13: 1000 ug via INTRAMUSCULAR

## 2015-02-13 NOTE — Progress Notes (Cosign Needed)
Pt presents for weight management. Pt today with weight gain of 1lbs. Pt states she as not been taking phentermine because she is under a lot of stress and the medication makes her more jittery in court. Pt states she is under a lot of stress currently (due to custody battle) and has not been eating well. Advised pt on healthy eating habits. Pt to follow up with M.Shambley next month (03/27/15)

## 2015-02-13 NOTE — Patient Instructions (Signed)
F/U in one month with provider.

## 2015-03-27 ENCOUNTER — Encounter: Payer: Self-pay | Admitting: Obstetrics and Gynecology

## 2015-03-27 ENCOUNTER — Ambulatory Visit (INDEPENDENT_AMBULATORY_CARE_PROVIDER_SITE_OTHER): Payer: Managed Care, Other (non HMO) | Admitting: Obstetrics and Gynecology

## 2015-03-27 VITALS — BP 112/73 | HR 86 | Wt 203.5 lb

## 2015-03-27 DIAGNOSIS — E669 Obesity, unspecified: Secondary | ICD-10-CM | POA: Diagnosis not present

## 2015-03-27 MED ORDER — CYANOCOBALAMIN 1000 MCG/ML IJ SOLN: 1000.0000 ug | Freq: Once | INTRAMUSCULAR | Status: DC

## 2015-03-27 MED ORDER — PHENTERMINE HCL 37.5 MG PO TABS: 37.5000 mg | ORAL_TABLET | Freq: Every day | ORAL | Status: DC

## 2015-03-27 NOTE — Progress Notes (Signed)
SUBJECTIVE:  35 y.o. here for follow-up weight loss visit, previously seen 4 weeks ago. Denies any concerns and feels like medication worked well, but stopped due to stressors and ran out of medicine. Desires restarting. Lost about 20#, but has regained 6#,  OBJECTIVE:  BP 112/73 mmHg  Pulse 86  Wt 203 lb 8 oz (92.307 kg)  Body mass index is 33.86 kg/(m^2). Patient appears well. ASSESSMENT:  Obesity- responding well to weight loss plan PLAN:  To continue with current medications-refills given. B12 105100mcg/ml injection given RTC in 4 weeks as planned  Yahsir Wickens [place], CNM

## 2015-05-01 ENCOUNTER — Ambulatory Visit (INDEPENDENT_AMBULATORY_CARE_PROVIDER_SITE_OTHER): Payer: Managed Care, Other (non HMO) | Admitting: Obstetrics and Gynecology

## 2015-05-01 VITALS — BP 111/78 | HR 76 | Wt 201.2 lb

## 2015-05-01 DIAGNOSIS — E669 Obesity, unspecified: Secondary | ICD-10-CM | POA: Diagnosis not present

## 2015-05-01 MED ORDER — CYANOCOBALAMIN 1000 MCG/ML IJ SOLN
1000.0000 ug | Freq: Once | INTRAMUSCULAR | Status: AC
  Administered 2015-05-01: 1000 ug via INTRAMUSCULAR

## 2015-05-01 NOTE — Progress Notes (Signed)
Patient ID: Cheyenne Rivera, female   DOB: 12/17/1980, 35 y.o.   MRN: [id number] Pt presents for weight, B/P, B-12 injection. No side effects of medication-Phentermine, or B-12.  Weight loss of 2 lbs. Encouraged eating healthy and exercise.

## 2015-06-05 ENCOUNTER — Ambulatory Visit: Payer: Managed Care, Other (non HMO)

## 2015-11-24 ENCOUNTER — Ambulatory Visit (INDEPENDENT_AMBULATORY_CARE_PROVIDER_SITE_OTHER): Payer: Managed Care, Other (non HMO) | Admitting: Family Medicine

## 2015-11-24 ENCOUNTER — Encounter: Payer: Self-pay | Admitting: Family Medicine

## 2015-11-24 DIAGNOSIS — J329 Chronic sinusitis, unspecified: Secondary | ICD-10-CM | POA: Insufficient documentation

## 2015-11-24 DIAGNOSIS — J0101 Acute recurrent maxillary sinusitis: Secondary | ICD-10-CM

## 2015-11-24 DIAGNOSIS — J302 Other seasonal allergic rhinitis: Secondary | ICD-10-CM | POA: Diagnosis not present

## 2015-11-24 DIAGNOSIS — J309 Allergic rhinitis, unspecified: Secondary | ICD-10-CM | POA: Insufficient documentation

## 2015-11-24 MED ORDER — HYDROCODONE-HOMATROPINE 5-1.5 MG/5ML PO SYRP: 5.0000 mL | ORAL_SOLUTION | Freq: Every evening | ORAL | 0 refills | Status: DC | PRN

## 2015-11-24 MED ORDER — PROMETHAZINE HCL 25 MG PO TABS: 25.0000 mg | ORAL_TABLET | Freq: Four times a day (QID) | ORAL | 5 refills | Status: DC | PRN

## 2015-11-24 MED ORDER — RIZATRIPTAN BENZOATE 10 MG PO TABS: 10.0000 mg | ORAL_TABLET | ORAL | 11 refills | Status: DC | PRN

## 2015-11-24 NOTE — Progress Notes (Signed)
   Subjective:    Patient ID: Cheyenne Rivera, female    DOB: 09/19/1980, 35 y.o.   MRN: [id number]  HPI   35 year old female pt of Dr. Cyndie Chimeopland's presents for  1 month of sinus symptoms.  She was  Seen at urgent care.. Dx with sinus infection, vertigo and bronchitis.  Treated with  Prednisone and  Z-pack.  Improved symptoms, to baseline but always has some sinus congestion.  1 .5 week ago: Returned  ST , sinus pressure, migraine Seen again at urgent care.  Treated with augmentin x 7 days.  On day 5/7.  No fever. Told to come in if symptoms not better Nasal congestion, sinus pressure/ no pain. Headace.  Left ear pain.Marland Kitchen.  Post nasal drip and  Dry cough.  Using mucinex.  No SOB, no wheeze   HX: no asthma, nonsmoker.   Review of Systems  Constitutional: Negative for fatigue and fever.  HENT: Positive for ear pain.   Eyes: Negative for pain.  Respiratory: Negative for chest tightness and shortness of breath.   Cardiovascular: Negative for chest pain, palpitations and leg swelling.  Gastrointestinal: Negative for abdominal pain.  Genitourinary: Negative for dysuria.       Objective:   Physical Exam  Constitutional: Vital signs are normal. She appears well-developed and well-nourished. She is cooperative.  Non-toxic appearance. She does not appear ill. No distress.  HENT:  Head: Normocephalic.  Right Ear: Hearing, tympanic membrane, external ear and ear canal normal. Tympanic membrane is not erythematous, not retracted and not bulging.  Left Ear: Hearing, tympanic membrane, external ear and ear canal normal. Tympanic membrane is not erythematous, not retracted and not bulging.  Nose: Mucosal edema and rhinorrhea present. Right sinus exhibits no maxillary sinus tenderness and no frontal sinus tenderness. Left sinus exhibits no maxillary sinus tenderness and no frontal sinus tenderness.  Mouth/Throat: Uvula is midline, oropharynx is clear and moist and mucous membranes are normal.    Eyes: Conjunctivae, EOM and lids are normal. Pupils are equal, round, and reactive to light. Lids are everted and swept, no foreign bodies found.  Neck: Trachea normal and normal range of motion. Neck supple. Carotid bruit is not present. No thyroid mass and no thyromegaly present.  Cardiovascular: Normal rate, regular rhythm, S1 normal, S2 normal, normal heart sounds, intact distal pulses and normal pulses.  Exam reveals no gallop and no friction rub.   No murmur heard. Pulmonary/Chest: Effort normal and breath sounds normal. No tachypnea. No respiratory distress. She has no decreased breath sounds. She has no wheezes. She has no rhonchi. She has no rales.  Neurological: She is alert.  Skin: Skin is warm, dry and intact. No rash noted.  Psychiatric: Her speech is normal and behavior is normal. Judgment normal. Her mood appears not anxious. Cognition and memory are normal. She does not exhibit a depressed mood.          Assessment & Plan:

## 2015-11-24 NOTE — Patient Instructions (Signed)
Can use cough suppressant at night.  Start flonase  2 sprays per nostril daily and nasal saline spray 2-3 times daily.  Continue mucinex.  If not improving can try Zytec at night.  Complete course of Augmentin.  Call if not improving as expected.

## 2015-11-24 NOTE — Assessment & Plan Note (Addendum)
Likely allergies no infection causing her symptoms at this point.  Add flonase and nasal saline irrigation.

## 2015-11-24 NOTE — Progress Notes (Signed)
Pre visit review using our clinic review tool, if applicable. No additional management support is needed unless otherwise documented below in the visit note. 

## 2015-11-24 NOTE — Assessment & Plan Note (Signed)
Complete antibitoics as already on.

## 2016-01-14 ENCOUNTER — Encounter: Payer: Self-pay | Admitting: Family Medicine

## 2016-01-14 ENCOUNTER — Ambulatory Visit (INDEPENDENT_AMBULATORY_CARE_PROVIDER_SITE_OTHER): Payer: 59 | Admitting: Family Medicine

## 2016-01-14 VITALS — BP 120/80 | HR 82 | Temp 98.3°F | Ht 65.0 in | Wt 239.2 lb

## 2016-01-14 DIAGNOSIS — K580 Irritable bowel syndrome with diarrhea: Secondary | ICD-10-CM | POA: Diagnosis not present

## 2016-01-14 DIAGNOSIS — Z23 Encounter for immunization: Secondary | ICD-10-CM

## 2016-01-14 DIAGNOSIS — K219 Gastro-esophageal reflux disease without esophagitis: Secondary | ICD-10-CM | POA: Diagnosis not present

## 2016-01-14 MED ORDER — PHENTERMINE HCL 37.5 MG PO CAPS: 37.5000 mg | ORAL_CAPSULE | ORAL | 1 refills | Status: DC

## 2016-01-14 MED ORDER — ELUXADOLINE 100 MG PO TABS: 1.0000 | ORAL_TABLET | Freq: Two times a day (BID) | ORAL | 5 refills | Status: DC

## 2016-01-14 NOTE — Progress Notes (Signed)
Dr. Karleen Hampshire T. Yannis Broce, MD, CAQ [facility] [address] [place], [address] Phone: [phone] Fax: [phone]  01/14/2016  Patient: Cheyenne Rivera, MRN: [id number], DOB: 09/23/1980, 36 y.o.  Primary Physician:  Hannah Beat, MD   Chief Complaint  Patient presents with  . Heartburn  . Stomach Issues    Gets diarrhea after eating  . Weight Loss    Wants to restart Phentermine   Subjective:   Cheyenne Rivera is a 36 y.o. very pleasant female patient who presents with the following:  Patient presents with a few different medical issues.  Hep B titers  IBS. Gets diarrhea a lot - fiber will make it worse.  Stomach will start cramping - has had some accidents. Very embarrassing. Immodium - hhas not tried this. Fiber tends to make the diarrhea worse.  Has also been having a lot of heartburn - eating a lot of Tums.  She has not tried any H2 blockers or PPIs.  Body mass index is 39.81 kg/m.  She is interested in going back on phentermine, and she did well with this last year.   At that point she was seeing a weight loss clinic in her OB/GYN's office.  She does not have high blood pressure.  She does not use any drugs.  Past Medical History, Surgical History, Social History, Family History, Problem List, Medications, and Allergies have been reviewed and updated if relevant.  Patient Active Problem List   Diagnosis Date Noted  . Allergic rhinitis 11/24/2015  . Migraine headache 08/22/2011  . Family history of breast cancer 06/09/2010  . SACROILIITIS, RIGHT 06/04/2009  . BACK PAIN 06/04/2009  . ANXIETY 05/05/2008  . DEPRESSION 05/05/2008    Past Medical History:  Diagnosis Date  . Anxiety   . Depression   . Migraine     Past Surgical History:  Procedure Laterality Date  . CHOLECYSTECTOMY    . INTRAUTERINE DEVICE INSERTION  03/2007   Mirena  . LEEP  3/03  . TONSILLECTOMY      Social History   Social History    . Marital status: Married    Spouse name: N/A  . Number of children: 2  . Years of education: N/A   Occupational History  . dental assistant [employer]   Social History Main Topics  . Smoking status: Never Smoker  . Smokeless tobacco: Never Used  . Alcohol use 0.0 oz/week     Comment: rare  . Drug use: No  . Sexual activity: Yes    Birth control/ protection: IUD     Comment: mirena   Other Topics Concern  . Not on file   Social History Narrative  . No narrative on file    Family History  Problem Relation Age of Onset  . Alcohol abuse Father   . Colon cancer Neg Hx   . Ovarian cancer Neg Hx   . Lung cancer Neg Hx   . Prostate cancer Neg Hx   . Coronary artery disease Neg Hx   . Stroke Neg Hx   . Diabetes Neg Hx   . Mental illness Neg Hx   . Breast cancer      Grandmother  . Sudden death      >50, Grand father, ?cause    Allergies  Allergen Reactions  . Codeine Other (See Comments)  . Morphine     REACTION: rash and nausea    Medication list reviewed and updated  [facility].   GEN: No acute illnesses, no fevers, chills. GI: No n/v/d, eating normally Pulm: No SOB Interactive and getting along well at home.  Otherwise, ROS is as per the HPI.  Objective:   BP 120/80   Pulse 82   Temp 98.3 F (36.8 C) (Oral)   Ht 5\' 5"  (1.651 m)   Wt 239 lb 4 oz (108.5 kg)   BMI 39.81 kg/m   GEN: WDWN, NAD, Non-toxic, A & O x 3 HEENT: Atraumatic, Normocephalic. Neck supple. No masses, No LAD. Ears and Nose: No external deformity. CV: RRR, No M/G/R. No JVD. No thrill. No extra heart sounds. PULM: CTA B, no wheezes, crackles, rhonchi. No retractions. No resp. distress. No accessory muscle use. EXTR: No c/c/e NEURO Normal gait.  PSYCH: Normally interactive. Conversant. Not depressed or anxious appearing.  Calm demeanor.   Laboratory and Imaging Data:  Assessment and Plan:   Irritable bowel syndrome with diarrhea  Need for  prophylactic vaccination with combined diphtheria-tetanus-pertussis (DTP) vaccine - Plan: Tdap vaccine greater than or equal to 7yo IM  Gastroesophageal reflux disease, esophagitis presence not specified  Severe obesity (BMI >= 40) (HCC)  Discussed IBS, fluids, fiber, probiotics.  Given the extent that this is influencing her life, we're also going to try one of the newer medications below to see if this helps with her diarrhea predominant IBS.  She also has reflux, and recommended that she use an H2 blocker initially to see if this helps.  She may also have some gastritis.  Obesity, she is given a work on her diet and exercise plan and we will start her back on phentermine.  I think that is reasonable for a 3 month time period with interval checks to make sure blood pressure is doing okay.  Follow-up: Return in about 4 weeks (around 02/11/2016).  Meds ordered this encounter  Medications  . Eluxadoline (VIBERZI) 100 MG TABS    Sig: Take 1 tablet by mouth 2 (two) times daily.    Dispense:  60 tablet    Refill:  5  . phentermine 37.5 MG capsule    Sig: Take 1 capsule (37.5 mg total) by mouth every morning.    Dispense:  30 capsule    Refill:  1   Medications Discontinued During This Encounter  Medication Reason  . HYDROcodone-homatropine (HYCODAN) 5-1.5 MG/5ML syrup Completed Course  . amoxicillin-clavulanate (AUGMENTIN) 875-125 MG tablet Completed Course   Orders Placed This Encounter  Procedures  . Tdap vaccine greater than or equal to 7yo IM   Patient Instructions  Zantac or Pepcid - OTC for Reflux  Probiotics like Align.   GETTING TO GOOD BOWEL HEALTH. Irregular bowel habits such as constipation and diarrhea can lead to many problems over time.  Having one soft bowel movement a day is the most important way to prevent further problems.  The anorectal canal is designed to handle stretching and feces to safely manage our ability to get rid of solid waste (feces, poop, stool)  out of our body.  BUT, hard constipated stools can act like ripping concrete bricks and diarrhea can be a burning fire to this very sensitive area of our body, causing inflamed hemorrhoids, anal fissures, increasing risk is perirectal abscesses, abdominal pain/bloating, an making irritable bowel worse.     The goal: ONE SOFT BOWEL MOVEMENT A DAY!  To have soft, regular bowel movements:  . Drink at least 8 tall glasses of water a day.   .Marland Kitchen  Take plenty of fiber.  Fiber is the undigested part of plant food that passes into the colon, acting s "natures broom" to encourage bowel motility and movement.  Fiber can absorb and hold large amounts of water. This results in a larger, bulkier stool, which is soft and easier to pass. Work gradually over several weeks up to 6 servings a day of fiber (25g a day even more if needed) in the form of: o Vegetables -- Root (potatoes, carrots, turnips), leafy green (lettuce, salad greens, celery, spinach), or cooked high residue (cabbage, broccoli, etc) o Fruit -- Fresh (unpeeled skin & pulp), Dried (prunes, apricots, cherries, etc ),  or stewed ( applesauce)  o Whole grain breads, pasta, etc (whole wheat)  o Bran cereals  . Bulking Agents -- This type of water-retaining fiber generally is easily obtained each day by one of the following:  o Psyllium bran -- The psyllium plant is remarkable because its ground seeds can retain so much water. This product is available as Metamucil, Konsyl, Effersyllium, Per Diem Fiber, or the less expensive generic preparation in drug and health food stores. Although labeled a laxative, it really is not a laxative.  o Methylcellulose -- This is another fiber derived from wood which also retains water. It is available as Citrucel. o Polyethylene Glycol - and "artificial" fiber commonly called Miralax or Glycolax.  It is helpful for people with gassy or bloated feelings with regular fiber o Flax Seed - a less gassy fiber than psyllium . No  reading or other relaxing activity while on the toilet. If bowel movements take longer than 5 minutes, you are too constipated . AVOID CONSTIPATION.  High fiber and water intake usually takes care of this.  Sometimes a laxative is needed to stimulate more frequent bowel movements, but  . Laxatives are not a good long-term solution as it can wear the colon out. o Osmotics (Milk of Magnesia, Fleets phosphosoda, Magnesium citrate, MiraLax, GoLytely) are safer than  o Stimulants (Senokot, Castor Oil, Dulcolax, Ex Lax)    o Do not take laxatives for more than 7days in a row. .  IF SEVERELY CONSTIPATED, try a Bowel Retraining Program: o Do not use laxatives.  o Eat a diet high in roughage, such as bran cereals and leafy vegetables.  o Drink six (6) ounces of prune or apricot juice each morning.  o Eat two (2) large servings of stewed fruit each day.  o Take one (1) heaping tablespoon of a psyllium-based bulking agent twice a day. Use sugar-free sweetener when possible to avoid excessive calories.  o Eat a normal breakfast.  o Set aside 15 minutes after breakfast to sit on the toilet, but do not strain to have a bowel movement.  o If you do not have a bowel movement by the third day, use an enema and repeat the above steps.  . Controlling diarrhea o Switch to liquids and simpler foods for a few days to avoid stressing your intestines further. o Avoid dairy products (especially milk & ice cream) for a short time.  The intestines often can lose the ability to digest lactose when stressed. o Avoid foods that cause gassiness or bloating.  Typical foods include beans and other legumes, cabbage, broccoli, and dairy foods.  Every person has some sensitivity to other foods, so listen to our body and avoid those foods that trigger problems for you. o Adding fiber (Citrucel, Metamucil, psyllium, Miralax) gradually can help thicken stools by absorbing excess fluid and  retrain the intestines to act more normally.   Slowly increase the dose over a few weeks.  Too much fiber too soon can backfire and cause cramping & bloating. o Probiotics (such as active yogurt, Align, etc) may help repopulate the intestines and colon with normal bacteria and calm down a sensitive digestive tract.  Most studies show it to be of mild help, though, and such products can be costly. o Medicines: - Bismuth subsalicylate (ex. Kayopectate, Pepto Bismol) every 30 minutes for up to 6 doses can help control diarrhea.  Avoid if pregnant. - Loperamide (Immodium) can slow down diarrhea.  Start with two tablets (4mg  total) first and then try one tablet every 6 hours.  Avoid if you are having fevers or severe pain.  If you are not better or start feeling worse, stop all medicines and call your doctor for advice -       Signed,  Elpidio Galea. Annessa Satre, MD   Allergies as of 01/14/2016      Reactions   Codeine Other (See Comments)   Morphine    REACTION: rash and nausea      Medication List       Accurate as of 01/14/16 11:59 PM. Always use your most recent med list.          Eluxadoline 100 MG Tabs Commonly known as:  VIBERZI Take 1 tablet by mouth 2 (two) times daily.   levonorgestrel 20 MCG/24HR IUD Commonly known as:  MIRENA 1 Intra Uterine Device (1 each total) by Intrauterine route once.   phentermine 37.5 MG capsule Take 1 capsule (37.5 mg total) by mouth every morning.   promethazine 25 MG tablet Commonly known as:  PHENERGAN Take 1 tablet (25 mg total) by mouth every 6 (six) hours as needed.   rizatriptan 10 MG tablet Commonly known as:  MAXALT Take 1 tablet (10 mg total) by mouth as needed for migraine. May repeat in 2 hours if needed

## 2016-01-14 NOTE — Progress Notes (Signed)
Pre visit review using our clinic review tool, if applicable. No additional management support is needed unless otherwise documented below in the visit note. 

## 2016-01-14 NOTE — Patient Instructions (Signed)
Zantac or Pepcid - OTC for Reflux  Probiotics like Align.   GETTING TO GOOD BOWEL HEALTH. Irregular bowel habits such as constipation and diarrhea can lead to many problems over time.  Having one soft bowel movement a day is the most important way to prevent further problems.  The anorectal canal is designed to handle stretching and feces to safely manage our ability to get rid of solid waste (feces, poop, stool) out of our body.  BUT, hard constipated stools can act like ripping concrete bricks and diarrhea can be a burning fire to this very sensitive area of our body, causing inflamed hemorrhoids, anal fissures, increasing risk is perirectal abscesses, abdominal pain/bloating, an making irritable bowel worse.     The goal: ONE SOFT BOWEL MOVEMENT A DAY!  To have soft, regular bowel movements:  . Drink at least 8 tall glasses of water a day.   . Take plenty of fiber.  Fiber is the undigested part of plant food that passes into the colon, acting s "natures broom" to encourage bowel motility and movement.  Fiber can absorb and hold large amounts of water. This results in a larger, bulkier stool, which is soft and easier to pass. Work gradually over several weeks up to 6 servings a day of fiber (25g a day even more if needed) in the form of: o Vegetables -- Root (potatoes, carrots, turnips), leafy green (lettuce, salad greens, celery, spinach), or cooked high residue (cabbage, broccoli, etc) o Fruit -- Fresh (unpeeled skin & pulp), Dried (prunes, apricots, cherries, etc ),  or stewed ( applesauce)  o Whole grain breads, pasta, etc (whole wheat)  o Bran cereals  . Bulking Agents -- This type of water-retaining fiber generally is easily obtained each day by one of the following:  o Psyllium bran -- The psyllium plant is remarkable because its ground seeds can retain so much water. This product is available as Metamucil, Konsyl, Effersyllium, Per Diem Fiber, or the less expensive generic preparation in  drug and health food stores. Although labeled a laxative, it really is not a laxative.  o Methylcellulose -- This is another fiber derived from wood which also retains water. It is available as Citrucel. o Polyethylene Glycol - and "artificial" fiber commonly called Miralax or Glycolax.  It is helpful for people with gassy or bloated feelings with regular fiber o Flax Seed - a less gassy fiber than psyllium . No reading or other relaxing activity while on the toilet. If bowel movements take longer than 5 minutes, you are too constipated . AVOID CONSTIPATION.  High fiber and water intake usually takes care of this.  Sometimes a laxative is needed to stimulate more frequent bowel movements, but  . Laxatives are not a good long-term solution as it can wear the colon out. o Osmotics (Milk of Magnesia, Fleets phosphosoda, Magnesium citrate, MiraLax, GoLytely) are safer than  o Stimulants (Senokot, Castor Oil, Dulcolax, Ex Lax)    o Do not take laxatives for more than 7days in a row. .  IF SEVERELY CONSTIPATED, try a Bowel Retraining Program: o Do not use laxatives.  o Eat a diet high in roughage, such as bran cereals and leafy vegetables.  o Drink six (6) ounces of prune or apricot juice each morning.  o Eat two (2) large servings of stewed fruit each day.  o Take one (1) heaping tablespoon of a psyllium-based bulking agent twice a day. Use sugar-free sweetener when possible to avoid excessive calories.  o  Eat a normal breakfast.  o Set aside 15 minutes after breakfast to sit on the toilet, but do not strain to have a bowel movement.  o If you do not have a bowel movement by the third day, use an enema and repeat the above steps.  . Controlling diarrhea o Switch to liquids and simpler foods for a few days to avoid stressing your intestines further. o Avoid dairy products (especially milk & ice cream) for a short time.  The intestines often can lose the ability to digest lactose when  stressed. o Avoid foods that cause gassiness or bloating.  Typical foods include beans and other legumes, cabbage, broccoli, and dairy foods.  Every person has some sensitivity to other foods, so listen to our body and avoid those foods that trigger problems for you. o Adding fiber (Citrucel, Metamucil, psyllium, Miralax) gradually can help thicken stools by absorbing excess fluid and retrain the intestines to act more normally.  Slowly increase the dose over a few weeks.  Too much fiber too soon can backfire and cause cramping & bloating. o Probiotics (such as active yogurt, Align, etc) may help repopulate the intestines and colon with normal bacteria and calm down a sensitive digestive tract.  Most studies show it to be of mild help, though, and such products can be costly. o Medicines: - Bismuth subsalicylate (ex. Kayopectate, Pepto Bismol) every 30 minutes for up to 6 doses can help control diarrhea.  Avoid if pregnant. - Loperamide (Immodium) can slow down diarrhea.  Start with two tablets (4mg  total) first and then try one tablet every 6 hours.  Avoid if you are having fevers or severe pain.  If you are not better or start feeling worse, stop all medicines and call your doctor for advice -

## 2016-01-15 ENCOUNTER — Encounter: Payer: Self-pay | Admitting: Family Medicine

## 2016-01-15 DIAGNOSIS — K589 Irritable bowel syndrome without diarrhea: Secondary | ICD-10-CM

## 2016-01-15 HISTORY — DX: Irritable bowel syndrome, unspecified: K58.9

## 2016-01-18 ENCOUNTER — Telehealth: Payer: Self-pay | Admitting: *Deleted

## 2016-01-18 NOTE — Telephone Encounter (Addendum)
PA Approved until 07/17/2016.  [facility] notified of approval via fax.

## 2016-01-18 NOTE — Telephone Encounter (Signed)
Received fax from [facility] requesting PA for Viberzi.  PA completed on CoverMyMeds.  Sent for review.  Can take up to 72 hours for a response.

## 2016-01-19 ENCOUNTER — Encounter: Payer: Self-pay | Admitting: *Deleted

## 2016-01-19 DIAGNOSIS — K5900 Constipation, unspecified: Secondary | ICD-10-CM | POA: Insufficient documentation

## 2016-01-19 DIAGNOSIS — R109 Unspecified abdominal pain: Secondary | ICD-10-CM | POA: Diagnosis present

## 2016-01-19 LAB — BASIC METABOLIC PANEL
ANION GAP: 7 (ref 5–15)
BUN: 11 mg/dL (ref 6–20)
CALCIUM: 9 mg/dL (ref 8.9–10.3)
CO2: 27 mmol/L (ref 22–32)
CREATININE: 0.68 mg/dL (ref 0.44–1.00)
Chloride: 105 mmol/L (ref 101–111)
GFR calc Af Amer: >60 mL/min (ref >60)
GLUCOSE: 108 mg/dL — AB (ref 65–99)
Potassium: 3.9 mmol/L (ref 3.5–5.1)
Sodium: 139 mmol/L (ref 135–145)

## 2016-01-19 LAB — CBC
HEMATOCRIT: 42.6 % (ref 35.0–47.0)
HEMOGLOBIN: 14.5 g/dL (ref 12.0–16.0)
MCH: 30.7 pg (ref 26.0–34.0)
MCHC: 33.9 g/dL (ref 32.0–36.0)
MCV: 90.5 fL (ref 80.0–100.0)
Platelets: 274 10*3/uL (ref 150–440)
RBC: 4.71 MIL/uL (ref 3.80–5.20)
RDW: 12.3 % (ref 11.5–14.5)
WBC: 13.8 10*3/uL — AB (ref 3.6–11.0)

## 2016-01-19 NOTE — ED Triage Notes (Signed)
Pt reports she has IBS.  Pt has diarrhea every day normally.  Pt reports no bm in 2 days. Pt gave herself an enema today without results.  Pt reports blood noted after enema. Pt has rectal pain.

## 2016-01-20 ENCOUNTER — Emergency Department
Admission: EM | Admit: 2016-01-20 | Discharge: 2016-01-20 | Disposition: A | Discharge status: Home / Self Care | Payer: 59 | Attending: Emergency Medicine | Admitting: Emergency Medicine

## 2016-01-20 ENCOUNTER — Encounter: Payer: Self-pay | Admitting: Family Medicine

## 2016-01-20 ENCOUNTER — Emergency Department: Payer: 59

## 2016-01-20 DIAGNOSIS — R1084 Generalized abdominal pain: Secondary | ICD-10-CM

## 2016-01-20 DIAGNOSIS — K5900 Constipation, unspecified: Secondary | ICD-10-CM

## 2016-01-20 LAB — URINALYSIS, ROUTINE W REFLEX MICROSCOPIC
Bilirubin Urine: NEGATIVE
Glucose, UA: NEGATIVE mg/dL
Hgb urine dipstick: NEGATIVE
KETONES UR: 5 mg/dL — AB
LEUKOCYTES UA: NEGATIVE
NITRITE: NEGATIVE
PROTEIN: NEGATIVE mg/dL
Specific Gravity, Urine: 1.016 (ref 1.005–1.030)
pH: 5 (ref 5.0–8.0)

## 2016-01-20 LAB — POCT PREGNANCY, URINE: PREG TEST UR: NEGATIVE

## 2016-01-20 MED ORDER — LACTULOSE 10 GM/15ML PO SOLN
20.0000 g | Freq: Once | ORAL | Status: AC
  Administered 2016-01-20: 20 g via ORAL
  Filled 2016-01-20: qty 30

## 2016-01-20 NOTE — ED Notes (Signed)
Pt returned from xray

## 2016-01-20 NOTE — ED Provider Notes (Signed)
[facility] Emergency Department Provider Note  ____________________________________________   First MD Initiated Contact with Patient 01/20/16 [phone]     (approximate)  I have reviewed the triage vital signs and the nursing notes.   HISTORY  Chief Complaint Constipation    HPI Cheyenne Rivera is a 36 y.o. female with a history of IBS-Dwho presents for evaluation of constipation and abdominal pain.  She reports that she usually has 3-4 episodes of diarrhea every day.  Her last bowel movement was on Sunday which was 3 days ago.  She did not have a bowel movement on Monday or Tuesday and has had gradually worsening abdominal pain and fullness since that time.  She has tried to manually disimpact herself and tried to use a mineral oil enema but did not have any success.  She reports that she has noted some blood in her enema output.  She has had some rectal pain.  General she describes symptoms as severe and nothing is making them better or worse.  She does not regularly take narcotics but did take a Vicodin yesterday in an attempt to treat the abdominal pain.  She recently started on a medication called Viberzi for IBS-D but she started that yesterday after she was already constipated.  She also takes phentermine which she has had in the past.  Past Medical History:  Diagnosis Date  . Anxiety   . Depression   . IBS (irritable bowel syndrome) 01/15/2016  . Migraine     Patient Active Problem List   Diagnosis Date Noted  . IBS (irritable bowel syndrome) 01/15/2016  . Severe obesity (BMI >= 40) (HCC) 01/15/2016  . Allergic rhinitis 11/24/2015  . Migraine headache 08/22/2011  . Family history of breast cancer 06/09/2010  . SACROILIITIS, RIGHT 06/04/2009  . BACK PAIN 06/04/2009  . ANXIETY 05/05/2008  . DEPRESSION 05/05/2008    Past Surgical History:  Procedure Laterality Date  . CHOLECYSTECTOMY    . INTRAUTERINE DEVICE INSERTION  03/2007   Mirena  . LEEP   3/03  . TONSILLECTOMY      Prior to Admission medications   Medication Sig Start Date End Date Taking? Authorizing Provider  Eluxadoline (VIBERZI) 100 MG TABS Take 1 tablet by mouth 2 (two) times daily. 01/14/16   Hannah Beat, MD  levonorgestrel (MIRENA) 20 MCG/24HR IUD 1 Intra Uterine Device (1 each total) by Intrauterine route once. 10/24/14   Melody N Shambley, CNM  phentermine 37.5 MG capsule Take 1 capsule (37.5 mg total) by mouth every morning. 01/14/16   Hannah Beat, MD  promethazine (PHENERGAN) 25 MG tablet Take 1 tablet (25 mg total) by mouth every 6 (six) hours as needed. 11/24/15   Amy Michelle Nasuti, MD  rizatriptan (MAXALT) 10 MG tablet Take 1 tablet (10 mg total) by mouth as needed for migraine. May repeat in 2 hours if needed 11/24/15   Excell Seltzer, MD    Allergies Codeine and Morphine  Family History  Problem Relation Age of Onset  . Alcohol abuse Father   . Breast cancer      Grandmother  . Sudden death      >50, Grand father, ?cause  . Colon cancer Neg Hx   . Ovarian cancer Neg Hx   . Lung cancer Neg Hx   . Prostate cancer Neg Hx   . Coronary artery disease Neg Hx   . Stroke Neg Hx   . Diabetes Neg Hx   . Mental illness Neg Hx  Social History Social History  Substance Use Topics  . Smoking status: Never Smoker  . Smokeless tobacco: Never Used  . Alcohol use 0.0 oz/week     Comment: rare    Review of Systems Constitutional: No fever/chills Eyes: No visual changes. ENT: No sore throat. Cardiovascular: Denies chest pain. Respiratory: Denies shortness of breath. Gastrointestinal: Abdominal pain and constipation x 2-3 days Genitourinary: Negative for dysuria. Musculoskeletal: Negative for back pain. Skin: Negative for rash. Neurological: Negative for headaches, focal weakness or numbness.  10-point ROS otherwise negative.  ____________________________________________   PHYSICAL EXAM:  VITAL SIGNS: ED Triage Vitals  Enc Vitals Group      BP 01/19/16 2237 127/81     Pulse Rate 01/19/16 2237 81     Resp 01/19/16 2237 (!) 2     Temp 01/19/16 2237 98.1 F (36.7 C)     Temp Source 01/19/16 2237 Oral     SpO2 01/19/16 2237 99 %     Weight 01/19/16 2238 239 lb (108.4 kg)     Height 01/19/16 2238 5\' 5"  (1.651 m)     Head Circumference --      Peak Flow --      Pain Score 01/19/16 2238 7     Pain Loc --      Pain Edu? --      Excl. in GC? --     Constitutional: Alert and oriented. Well appearing and in no acute distress. Eyes: Conjunctivae are normal. PERRL. EOMI. Head: Atraumatic. Nose: No congestion/rhinnorhea. Mouth/Throat: Mucous membranes are moist.  Oropharynx non-erythematous. Neck: No stridor.  No meningeal signs.   Cardiovascular: Normal rate, regular rhythm. Good peripheral circulation. Grossly normal heart sounds. Respiratory: Normal respiratory effort.  No retractions. Lungs CTAB. Gastrointestinal: Soft with mild diffuse tenderness to palpation throughout her abdomen Musculoskeletal: No lower extremity tenderness nor edema. No gross deformities of extremities. Neurologic:  Normal speech and language. No gross focal neurologic deficits are appreciated.  Skin:  Skin is warm, dry and intact. No rash noted. Psychiatric: Mood and affect are normal. Speech and behavior are normal.  ____________________________________________   LABS (all labs ordered are listed, but only abnormal results are displayed)  Labs Reviewed  BASIC METABOLIC PANEL - Abnormal; Notable for the following:       Result Value   Glucose, Bld 108 (*)    All other components within normal limits  CBC - Abnormal; Notable for the following:    WBC 13.8 (*)    All other components within normal limits  URINALYSIS, ROUTINE W REFLEX MICROSCOPIC - Abnormal; Notable for the following:    Color, Urine YELLOW (*)    APPearance CLEAR (*)    Ketones, ur 5 (*)    All other components within normal limits  POC URINE PREG, ED  POCT PREGNANCY, URINE     ____________________________________________  EKG  None - EKG not ordered by ED physician ____________________________________________  RADIOLOGY   Dg Abdomen Acute W/chest  Result Date: 01/20/2016 CLINICAL DATA:  The irritable bowel syndrome. Abnormally has diarrhea every day but no bowel movement now 1 2 days. Enema without results. Blood after enema. Rectal pain. EXAM: DG ABDOMEN ACUTE W/ 1V CHEST COMPARISON:  CT abdomen and pelvis 03/23/2012.  Chest 11/16/2009. FINDINGS: Normal heart size and pulmonary vascularity. No focal airspace disease or consolidation in the lungs. No blunting of costophrenic angles. No pneumothorax. Mediastinal contours appear intact. Scattered gas and stool in the colon. No small or large bowel distention. No free intra-abdominal  air. No abnormal air-fluid levels. No radiopaque stones. Visualized bones appear intact. Surgical clips in the right upper quadrant. Intrauterine device. IMPRESSION: No evidence of active pulmonary disease. Nonobstructive bowel gas pattern. Electronically Signed   By: Burman NievesWilliam  Stevens M.D.   On: 01/20/2016 05:56    ____________________________________________   PROCEDURES  Procedure(s) performed:   Procedures   Critical Care performed: No ____________________________________________   INITIAL IMPRESSION / ASSESSMENT AND PLAN / ED COURSE  Pertinent labs & imaging results that were available during my care of the patient were reviewed by me and considered in my medical decision making (see chart for details).  The patient is well-appearing and in no acute distress.  She has normal vital signs and normal lab work.  She has no large light abnormalities that might provide a reason for constipation.  I suspect that she is experiencing some functional constipation either due to changes in her IBS symptoms, medication side effects, or both.  She has not been vomiting and there is no reason to believe she would spontaneously develop  a bowel obstruction.  I will obtain an acute abdomen series to look for any signs of obstruction or dilated loops of bowel but at this point I do not believe that a CT scan of the abdomen or pelvis is appropriate.  Depending on the results of the radiographs, I may attempt a manual disimpaction or we may try oral treatment such as lactulose.  Mother and patient agree with the plan.   Clinical Course as of Jan 19 818  Wed Jan 20, 2016  0709 No evidence of obstruction, ileus, or large stool ball or large stool burden in general on her radiographs.  The patient was sleeping comfortably when I went back to check on her.  I discussed with her about whether or not she wants me to attempt a fecal disimpaction or whether she wants to take oral medication to help her have a bowel movement, but there is no evidence of acute or emergent medical condition at this time.  She has been in the emergency department for over 8 hours and continues to have stable vital signs and is not in any significant distress.  We agreed that we would give her a dose of lactulose immediately before discharge and then she would head home so that she could be more comfortable in her own house.  I gave my usual customary abdominal pain and constipation recommendations and return precautions.  She will follow up with her regular doctor.  [CF]    Clinical Course User Index [CF] Loleta Roseory Cuong Moorman, MD    ____________________________________________  FINAL CLINICAL IMPRESSION(S) / ED DIAGNOSES  Final diagnoses:  Constipation, unspecified constipation type  Generalized abdominal pain     MEDICATIONS GIVEN DURING THIS VISIT:  Medications  lactulose (CHRONULAC) 10 GM/15ML solution 20 g (20 g Oral Given 01/20/16 0733)     NEW OUTPATIENT MEDICATIONS STARTED DURING THIS VISIT:  Discharge Medication List as of 01/20/2016  7:14 AM      Discharge Medication List as of 01/20/2016  7:14 AM      Discharge Medication List as of 01/20/2016   7:14 AM       Note:  This document was prepared using Dragon voice recognition software and may include unintentional dictation errors.    Loleta Roseory Malaky Tetrault, MD 01/20/16 [phone]

## 2016-01-20 NOTE — Discharge Instructions (Signed)
You have been seen in the Emergency Department (ED) for abdominal pain.  Your evaluation did not identify a clear cause of your symptoms but was generally reassuring.  To help with constipation, we recommend trying the following (start with the first and add additional treatments as needed):  1)  Colace (or Dulcolax) 100 mg:  This is a stool softener, and you may take it once or twice a day as needed. 2)  Senna tablets:  This is a bowel stimulant that will help "push" out your stool. It is the next step to add after you have tried a stool softener. 3)  Miralax (powder):  This medication works by drawing additional fluid into your intestines and helps to flush out your stool.  Mix the powder with water or juice according to label instructions.  It may help if the Colace and Senna are not sufficient, but you must be sure to use the recommended amount of water or juice when you mix up the powder. Remember that narcotic pain medications are constipating, so avoid them or minimize their use.  Drink plenty of fluids.   Please follow up as instructed above regarding today?s emergent visit and the symptoms that are bothering you.  Return to the ED if your abdominal pain worsens or fails to improve, you develop bloody vomiting, bloody diarrhea, you are unable to tolerate fluids due to vomiting, fever greater than 101, or other symptoms that concern you.

## 2016-01-26 ENCOUNTER — Other Ambulatory Visit: Payer: Self-pay

## 2016-01-26 ENCOUNTER — Other Ambulatory Visit (INDEPENDENT_AMBULATORY_CARE_PROVIDER_SITE_OTHER): Payer: 59

## 2016-01-26 DIAGNOSIS — Z13220 Encounter for screening for lipoid disorders: Secondary | ICD-10-CM | POA: Diagnosis not present

## 2016-01-26 DIAGNOSIS — R5383 Other fatigue: Secondary | ICD-10-CM

## 2016-01-26 DIAGNOSIS — E538 Deficiency of other specified B group vitamins: Secondary | ICD-10-CM | POA: Diagnosis not present

## 2016-01-26 DIAGNOSIS — E559 Vitamin D deficiency, unspecified: Secondary | ICD-10-CM

## 2016-01-26 LAB — HEPATIC FUNCTION PANEL
ALT: 44 U/L — ABNORMAL HIGH (ref 0–35)
AST: 25 U/L (ref 0–37)
Albumin: 4.5 g/dL (ref 3.5–5.2)
Alkaline Phosphatase: 42 U/L (ref 39–117)
BILIRUBIN TOTAL: 0.5 mg/dL (ref 0.2–1.2)
Bilirubin, Direct: 0.1 mg/dL (ref 0.0–0.3)
Total Protein: 7 g/dL (ref 6.0–8.3)

## 2016-01-26 LAB — CBC WITH DIFFERENTIAL/PLATELET
BASOS PCT: 0.4 % (ref 0.0–3.0)
Basophils Absolute: 0 10*3/uL (ref 0.0–0.1)
EOS ABS: 0.1 10*3/uL (ref 0.0–0.7)
Eosinophils Relative: 1.1 % (ref 0.0–5.0)
HEMATOCRIT: 42.7 % (ref 36.0–46.0)
Hemoglobin: 14.5 g/dL (ref 12.0–15.0)
LYMPHS PCT: 28.1 % (ref 12.0–46.0)
Lymphs Abs: 3 10*3/uL (ref 0.7–4.0)
MCHC: 34 g/dL (ref 30.0–36.0)
MCV: 91.9 fl (ref 78.0–100.0)
Monocytes Absolute: 0.5 10*3/uL (ref 0.1–1.0)
Monocytes Relative: 5 % (ref 3.0–12.0)
NEUTROS ABS: 6.9 10*3/uL (ref 1.4–7.7)
Neutrophils Relative %: 65.4 % (ref 43.0–77.0)
PLATELETS: 293 10*3/uL (ref 150.0–400.0)
RBC: 4.65 Mil/uL (ref 3.87–5.11)
RDW: 12.3 % (ref 11.5–15.5)
WBC: 10.5 10*3/uL (ref 4.0–10.5)

## 2016-01-26 LAB — BASIC METABOLIC PANEL
BUN: 11 mg/dL (ref 6–23)
CALCIUM: 9.7 mg/dL (ref 8.4–10.5)
CO2: 29 mEq/L (ref 19–32)
CREATININE: 0.74 mg/dL (ref 0.40–1.20)
Chloride: 103 mEq/L (ref 96–112)
GFR: 94.28 mL/min (ref >60.00)
Glucose, Bld: 96 mg/dL (ref 70–99)
POTASSIUM: 4.3 meq/L (ref 3.5–5.1)
Sodium: 140 mEq/L (ref 135–145)

## 2016-01-26 LAB — LIPID PANEL
CHOLESTEROL: 134 mg/dL (ref 0–200)
HDL: 49.6 mg/dL (ref >39.00)
LDL CALC: 66 mg/dL (ref 0–99)
NonHDL: 84.21
TRIGLYCERIDES: 91 mg/dL (ref 0.0–149.0)
Total CHOL/HDL Ratio: 3
VLDL: 18.2 mg/dL (ref 0.0–40.0)

## 2016-01-26 LAB — VITAMIN B12: VITAMIN B 12: 266 pg/mL (ref 211–911)

## 2016-01-26 LAB — TSH: TSH: 1.97 u[IU]/mL (ref 0.35–4.50)

## 2016-01-26 LAB — VITAMIN D 25 HYDROXY (VIT D DEFICIENCY, FRACTURES): VITD: 16.21 ng/mL — AB (ref 30.00–100.00)

## 2016-02-11 ENCOUNTER — Ambulatory Visit (INDEPENDENT_AMBULATORY_CARE_PROVIDER_SITE_OTHER): Payer: 59 | Admitting: Family Medicine

## 2016-02-11 ENCOUNTER — Encounter: Payer: Self-pay | Admitting: Family Medicine

## 2016-02-11 VITALS — BP 118/72 | HR 95 | Temp 97.8°F | Ht 65.5 in | Wt 230.8 lb

## 2016-02-11 DIAGNOSIS — Z0000 Encounter for general adult medical examination without abnormal findings: Secondary | ICD-10-CM

## 2016-02-11 NOTE — Progress Notes (Signed)
Pre visit review using our clinic review tool, if applicable. No additional management support is needed unless otherwise documented below in the visit note. 

## 2016-02-11 NOTE — Progress Notes (Signed)
Dr. Frederico Hamman T. Becker Christopher, MD, [facility] [place], [address] Phone: [phone] Fax: [phone]  02/11/2016  Patient: Cheyenne Rivera, MRN: [id number], DOB: Jun 29, 1980, 36 y.o.  Primary Physician:  Owens Loffler, MD   Chief Complaint  Patient presents with  . Annual Exam   Subjective:   Cheyenne Rivera is a 36 y.o. pleasant patient who presents with the following:  Health Maintenance Summary Reviewed and updated, unless pt declines services.  IBS symptoms have improved with diet changes and we reviewed, and she is also doing some probiotics.  She is lost about 10 pounds, currently on phentermine.  Plan is to use this for 3 months then discontinue.  Tobacco History Reviewed. Non-smoker Alcohol: No concerns, no excessive use Exercise Habits: rare now activity, rec at least 30 mins 5 times a week STD concerns: none Drug Use: None Birth control method: IUD Menses regular: yes Lumps or breast concerns: no Breast Cancer Family History: GM  Health Maintenance  Topic Date Due  . PAP SMEAR  08/27/2017  . TETANUS/TDAP  01/13/2026  . INFLUENZA VACCINE  Completed  . HIV Screening  Completed    Immunization History  Administered Date(s) Administered  . Influenza-Unspecified 10/11/2014, 10/27/2015  . Tdap 01/14/2016   Patient Active Problem List   Diagnosis Date Noted  . IBS (irritable bowel syndrome) 01/15/2016  . Severe obesity (BMI >= 40) ([facility]) 01/15/2016  . Allergic rhinitis 11/24/2015  . Migraine headache 08/22/2011  . Family history of breast cancer 06/09/2010  . SACROILIITIS, RIGHT 06/04/2009  . BACK PAIN 06/04/2009  . ANXIETY 05/05/2008  . DEPRESSION 05/05/2008   Past Medical History:  Diagnosis Date  . Anxiety   . Depression   . IBS (irritable bowel syndrome) 01/15/2016  . Migraine    Past Surgical History:  Procedure Laterality Date  . CHOLECYSTECTOMY    . INTRAUTERINE DEVICE INSERTION   03/2007   Mirena  . LEEP  3/03  . TONSILLECTOMY     Social History   Social History  . Marital status: Single    Spouse name: N/A  . Number of children: 2  . Years of education: N/A   Occupational History  . dental assistant [employer]   Social History Main Topics  . Smoking status: Never Smoker  . Smokeless tobacco: Never Used  . Alcohol use 0.0 oz/week     Comment: rare  . Drug use: No  . Sexual activity: Yes    Birth control/ protection: IUD     Comment: mirena   Other Topics Concern  . Not on file   Social History Narrative  . No narrative on file   Family History  Problem Relation Age of Onset  . Alcohol abuse Father   . Breast cancer      Grandmother  . Sudden death      >50, [place] father, ?cause  . Colon cancer Neg Hx   . Ovarian cancer Neg Hx   . Lung cancer Neg Hx   . Prostate cancer Neg Hx   . Coronary artery disease Neg Hx   . Stroke Neg Hx   . Diabetes Neg Hx   . Mental illness Neg Hx    Allergies  Allergen Reactions  . Codeine Other (See Comments)  . Morphine     REACTION: rash and nausea    Medication list has been reviewed and updated.   General: Denies fever, chills, sweats. No significant  weight loss. Eyes: Denies blurring,significant itching ENT: Denies earache, sore throat, and hoarseness.  Cardiovascular: Denies chest pains, palpitations, dyspnea on exertion,  Respiratory: Denies cough, dyspnea at rest,wheeezing Breast: no concerns about lumps GI: Denies nausea, vomiting, diarrhea, constipation, change in bowel habits, abdominal pain, melena, hematochezia GU: Denies dysuria, hematuria, urinary hesitancy, nocturia, denies STD risk, no concerns about discharge Musculoskeletal: Denies back pain, joint pain Derm: Denies rash, itching Neuro: Denies  paresthesias, frequent falls, frequent headaches Psych: Denies depression, anxiety Endocrine: Denies cold intolerance, heat intolerance, polydipsia Heme: Denies enlarged  lymph nodes Allergy: No hayfever  Objective:   BP 118/72   Pulse 95   Temp 97.8 F (36.6 C) (Oral)   Ht 5' 5.5" (1.664 m)   Wt 230 lb 12 oz (104.7 kg)   BMI 37.81 kg/m  No exam data present  GEN: well developed, well nourished, no acute distress Eyes: conjunctiva and lids normal, PERRLA, EOMI ENT: TM clear, nares clear, oral exam WNL Neck: supple, no lymphadenopathy, no thyromegaly, no JVD Pulm: clear to auscultation and percussion, respiratory effort normal CV: regular rate and rhythm, S1-S2, no murmur, rub or gallop, no bruits Chest: no scars, masses, no lumps BREAST: breast exam declined GI: soft, non-tender; no hepatosplenomegaly, masses; active bowel sounds all quadrants GU: GU exam declined Lymph: no cervical, axillary or inguinal adenopathy MSK: gait normal, muscle tone and strength WNL, no joint swelling, effusions, discoloration, crepitus  SKIN: clear, good turgor, color WNL, no rashes, lesions, or ulcerations Neuro: normal mental status, normal strength, sensation, and motion Psych: alert; oriented to person, place and time, normally interactive and not anxious or depressed in appearance.   All labs reviewed with patient. Lipids:    Component Value Date/Time   CHOL 134 01/26/2016 1415   TRIG 91.0 01/26/2016 1415   HDL 49.60 01/26/2016 1415   LDLDIRECT 72.7 06/04/2009 1626   VLDL 18.2 01/26/2016 1415   CHOLHDL 3 01/26/2016 1415   CBC: CBC Latest Ref Rng & Units 01/26/2016 01/19/2016 08/04/2014  WBC 4.0 - 10.5 K/uL 10.5 13.8(H) 11.8(H)  Hemoglobin 12.0 - 15.0 g/dL 26.2 69.2 39.0  Hematocrit 36.0 - 46.0 % 42.7 42.6 44.8  Platelets 150.0 - 400.0 K/uL 293.0 274 259.0    Basic Metabolic Panel:    Component Value Date/Time   NA 140 01/26/2016 1415   NA 142 03/24/2012 0545   K 4.3 01/26/2016 1415   K 3.5 03/24/2012 0545   CL 103 01/26/2016 1415   CL 110 (H) 03/24/2012 0545   CO2 29 01/26/2016 1415   CO2 29 03/24/2012 0545   BUN 11 01/26/2016 1415   BUN 9  03/24/2012 0545   CREATININE 0.74 01/26/2016 1415   CREATININE 0.70 03/24/2012 0545   GLUCOSE 96 01/26/2016 1415   GLUCOSE 108 (H) 03/24/2012 0545   CALCIUM 9.7 01/26/2016 1415   CALCIUM 8.0 (L) 03/24/2012 0545   Hepatic Function Latest Ref Rng & Units 01/26/2016 08/04/2014 03/24/2012  Total Protein 6.0 - 8.3 g/dL 7.0 7.1 6.1(L)  Albumin 3.5 - 5.2 g/dL 4.5 4.4 7.4(D)  AST 0 - 37 U/L 25 30 255(H)  ALT 0 - 35 U/L 44(H) 38(H) 313(H)  Alk Phosphatase 39 - 117 U/L 42 44 80  Total Bilirubin 0.2 - 1.2 mg/dL 0.5 0.3 0.7  Bilirubin, Direct 0.0 - 0.3 mg/dL 0.1 0.1 -    Lab Results  Component Value Date   TSH 1.97 01/26/2016    Assessment and Plan:   Healthcare maintenance  Health Maintenance Exam: The  patient's preventative maintenance and recommended screening tests for an annual wellness exam were reviewed in full today. Brought up to date unless services declined.  Counselled on the importance of diet, exercise, and its role in overall health and mortality. The patient's FH and SH was reviewed, including their home life, tobacco status, and drug and alcohol status.  Follow-up in 1 year for physical exam or additional follow-up below.  Follow-up: Return in about 4 weeks (around 03/10/2016). For phentermine / weight check  Medications Discontinued During This Encounter  Medication Reason  . Eluxadoline (VIBERZI) 100 MG TABS    Signed,  Leiloni Smithers T. Denia Mcvicar, MD   Allergies as of 02/11/2016      Reactions   Codeine Other (See Comments)   Morphine    REACTION: rash and nausea      Medication List       Accurate as of 02/11/16 11:59 PM. Always use your most recent med list.          levonorgestrel 20 MCG/24HR IUD Commonly known as:  MIRENA 1 Intra Uterine Device (1 each total) by Intrauterine route once.   phentermine 37.5 MG capsule Take 1 capsule (37.5 mg total) by mouth every morning.   promethazine 25 MG tablet Commonly known as:  PHENERGAN Take 1 tablet (25 mg  total) by mouth every 6 (six) hours as needed.   rizatriptan 10 MG tablet Commonly known as:  MAXALT Take 1 tablet (10 mg total) by mouth as needed for migraine. May repeat in 2 hours if needed

## 2016-02-12 ENCOUNTER — Encounter: Payer: Self-pay | Admitting: Family Medicine

## 2016-03-10 ENCOUNTER — Ambulatory Visit: Payer: 59 | Admitting: Family Medicine

## 2016-03-24 ENCOUNTER — Encounter: Payer: Self-pay | Admitting: Family Medicine

## 2016-03-24 ENCOUNTER — Ambulatory Visit (INDEPENDENT_AMBULATORY_CARE_PROVIDER_SITE_OTHER): Payer: 59 | Admitting: Family Medicine

## 2016-03-24 VITALS — BP 122/80 | HR 87 | Temp 98.5°F | Ht 65.5 in | Wt 228.8 lb

## 2016-03-24 DIAGNOSIS — M722 Plantar fascial fibromatosis: Secondary | ICD-10-CM

## 2016-03-24 DIAGNOSIS — K580 Irritable bowel syndrome with diarrhea: Secondary | ICD-10-CM | POA: Diagnosis not present

## 2016-03-24 MED ORDER — PHENTERMINE HCL 37.5 MG PO CAPS: 37.5000 mg | ORAL_CAPSULE | ORAL | 0 refills | Status: DC

## 2016-03-24 MED ORDER — CILIDINIUM-CHLORDIAZEPOXIDE 2.5-5 MG PO CAPS: 1.0000 | ORAL_CAPSULE | Freq: Three times a day (TID) | ORAL | 3 refills | Status: DC

## 2016-03-24 NOTE — Progress Notes (Signed)
Pre visit review using our clinic review tool, if applicable. No additional management support is needed unless otherwise documented below in the visit note. 

## 2016-03-24 NOTE — Progress Notes (Signed)
Dr. Karleen Hampshire T. Christana Angelica, MD, CAQ [facility] [address] [place], [address] Phone: [phone] Fax: [phone]  03/24/2016  Patient: Cheyenne Rivera, MRN: [id number], DOB: October 04, 1980, 36 y.o.  Primary Physician:  Hannah Beat, MD   Chief Complaint  Patient presents with  . Follow-up    4 week f/u/ Weight check  . Headache   Subjective:   Cheyenne Rivera is a 36 y.o. very pleasant female patient who presents with the following:  Wt Readings from Last 3 Encounters:  03/24/16 228 lb 12 oz (103.8 kg)  02/11/16 230 lb 12 oz (104.7 kg)  01/19/16 239 lb (108.4 kg)    Has lost some inches - has been doing some boxing classes, going to try zumba. Elliptical. She is excited now by working out.  A couple of times has had to leave work from   She also has some new onset plantar fasciitis.  She currently has a migraine.  Past Medical History, Surgical History, Social History, Family History, Problem List, Medications, and Allergies have been reviewed and updated if relevant.  Patient Active Problem List   Diagnosis Date Noted  . IBS (irritable bowel syndrome) 01/15/2016  . Severe obesity (BMI >= 40) (HCC) 01/15/2016  . Allergic rhinitis 11/24/2015  . Migraine headache 08/22/2011  . Family history of breast cancer 06/09/2010  . SACROILIITIS, RIGHT 06/04/2009  . BACK PAIN 06/04/2009  . ANXIETY 05/05/2008  . DEPRESSION 05/05/2008    Past Medical History:  Diagnosis Date  . Anxiety   . Depression   . IBS (irritable bowel syndrome) 01/15/2016  . Migraine     Past Surgical History:  Procedure Laterality Date  . CHOLECYSTECTOMY    . INTRAUTERINE DEVICE INSERTION  03/2007   Mirena  . LEEP  3/03  . TONSILLECTOMY      Social History   Social History  . Marital status: Single    Spouse name: N/A  . Number of children: 2  . Years of education: N/A   Occupational History  . dental assistant     [employer]   Social  History Main Topics  . Smoking status: Never Smoker  . Smokeless tobacco: Never Used  . Alcohol use 0.0 oz/week     Comment: rare  . Drug use: No  . Sexual activity: Yes    Birth control/ protection: IUD     Comment: mirena   Other Topics Concern  . Not on file   Social History Narrative  . No narrative on file    Family History  Problem Relation Age of Onset  . Alcohol abuse Father   . Breast cancer      Grandmother  . Sudden death      >50, Grand father, ?cause  . Colon cancer Neg Hx   . Ovarian cancer Neg Hx   . Lung cancer Neg Hx   . Prostate cancer Neg Hx   . Coronary artery disease Neg Hx   . Stroke Neg Hx   . Diabetes Neg Hx   . Mental illness Neg Hx     Allergies  Allergen Reactions  . Codeine Other (See Comments)  . Morphine     REACTION: rash and nausea    Medication list reviewed and updated in full in [facility].   GEN: No acute illnesses, no fevers, chills. GI: No n/v/d, eating normally Pulm: No SOB Interactive and getting along well at home.  Otherwise, ROS is  as per the HPI.  Objective:   BP 122/80 (BP Location: Right Arm, Patient Position: Sitting, Cuff Size: Large)   Pulse 87   Temp 98.5 F (36.9 C) (Oral)   Ht 5' 5.5" (1.664 m)   Wt 228 lb 12 oz (103.8 kg)   SpO2 97%   BMI 37.49 kg/m   GEN: WDWN, NAD, Non-toxic, A & O x 3 HEENT: Atraumatic, Normocephalic. Neck supple. No masses, No LAD. Ears and Nose: No external deformity. CV: RRR, No M/G/R. No JVD. No thrill. No extra heart sounds. PULM: CTA B, no wheezes, crackles, rhonchi. No retractions. No resp. distress. No accessory muscle use. EXTR: No c/c/e NEURO Normal gait.  PSYCH: Normally interactive. Conversant. Not depressed or anxious appearing.  Calm demeanor.   Left-sided heel is tender to palpation.  Laboratory and Imaging Data:  Assessment and Plan:   Irritable bowel syndrome with diarrhea  Severe obesity (BMI >= 40) (HCC)  Plantar fascia syndrome     Trial of librax for IBS  Reviewed PF rehab and care  Cont to do well with phentermine and exercise.  Follow-up: No Follow-up on file.  Meds ordered this encounter  Medications  . clidinium-chlordiazePOXIDE (LIBRAX) 5-2.5 MG capsule    Sig: Take 1 capsule by mouth 3 (three) times daily before meals. As needed.    Dispense:  60 capsule    Refill:  3  . phentermine 37.5 MG capsule    Sig: Take 1 capsule (37.5 mg total) by mouth every morning.    Dispense:  30 capsule    Refill:  0   Medications Discontinued During This Encounter  Medication Reason  . phentermine 37.5 MG capsule Reorder    Signed,  Karleen HampshireSpencer T. Takeira Yanes, MD   Allergies as of 03/24/2016      Reactions   Codeine Other (See Comments)   Morphine    REACTION: rash and nausea      Medication List       Accurate as of 03/24/16 11:59 PM. Always use your most recent med list.          clidinium-chlordiazePOXIDE 5-2.5 MG capsule Commonly known as:  LIBRAX Take 1 capsule by mouth 3 (three) times daily before meals. As needed.   levonorgestrel 20 MCG/24HR IUD Commonly known as:  MIRENA 1 Intra Uterine Device (1 each total) by Intrauterine route once.   phentermine 37.5 MG capsule Take 1 capsule (37.5 mg total) by mouth every morning.   promethazine 25 MG tablet Commonly known as:  PHENERGAN Take 1 tablet (25 mg total) by mouth every 6 (six) hours as needed.   rizatriptan 10 MG tablet Commonly known as:  MAXALT Take 1 tablet (10 mg total) by mouth as needed for migraine. May repeat in 2 hours if needed

## 2016-05-24 ENCOUNTER — Encounter: Payer: Self-pay | Admitting: Family Medicine

## 2017-02-22 ENCOUNTER — Telehealth: Payer: Self-pay | Admitting: Family Medicine

## 2017-02-22 ENCOUNTER — Other Ambulatory Visit: Payer: Self-pay | Admitting: *Deleted

## 2017-02-22 MED ORDER — RIZATRIPTAN BENZOATE 10 MG PO TABS: 10.0000 mg | ORAL_TABLET | ORAL | 1 refills | Status: DC | PRN

## 2017-02-22 NOTE — Telephone Encounter (Signed)
Should be ok

## 2017-02-22 NOTE — Telephone Encounter (Signed)
Pt made an appointment through mychart for 2/19 with Dr Ermalene SearingBedsole. I just wanted to make sure it was ok to wait till then. See below message  Message         ----- Message -----  From: Cheyenne Rivera  Sent: 02/21/2017  8:30 PM  To: Cheyenne [facility] Admin Pool  Subject: Appointment scheduled from MyChart          Appointment For: Cheyenne Rivera ([id number])  Visit Type: MYCHART OFFICE VISIT ([id number])    02/28/2017  11:45 AM 15 mins. Excell SeltzerBedsole, Amy E, MD    [facility]    Patient Comments:  Foot pain for months now... have tried several things with no relief. Throbbing and swelling with shooting pain. my day off work is Tuesdays which my provider doesn't work this day. I normally see Dr Cheyenne Rivera.

## 2017-02-23 MED ORDER — PROMETHAZINE HCL 25 MG PO TABS: 25.0000 mg | ORAL_TABLET | Freq: Four times a day (QID) | ORAL | 1 refills | Status: DC | PRN

## 2017-02-23 NOTE — Telephone Encounter (Addendum)
Last office visit 03/24/2016.  Patient scheduled for her CPE on 05/01/2017.  Last refilled 11/24/2015 for #30 with 5 refills.   Ok to refill?

## 2017-02-23 NOTE — Addendum Note (Signed)
Addended by: Damita LackLORING, Avari Gelles S on: 02/23/2017 09:01 AM   Modules accepted: Orders

## 2017-02-28 ENCOUNTER — Ambulatory Visit: Payer: 59 | Admitting: Family Medicine

## 2017-02-28 ENCOUNTER — Encounter: Payer: Self-pay | Admitting: Family Medicine

## 2017-02-28 VITALS — BP 110/78 | HR 78 | Temp 98.1°F | Ht 65.5 in | Wt 240.0 lb

## 2017-02-28 DIAGNOSIS — M7661 Achilles tendinitis, right leg: Secondary | ICD-10-CM

## 2017-02-28 MED ORDER — DICLOFENAC SODIUM 75 MG PO TBEC: 75.0000 mg | DELAYED_RELEASE_TABLET | Freq: Two times a day (BID) | ORAL | 0 refills | Status: DC

## 2017-02-28 NOTE — Assessment & Plan Note (Signed)
ICe, NSAIDs, home PT. If not improving eval with X-ray and possible referral.

## 2017-02-28 NOTE — Progress Notes (Signed)
   Subjective:    Patient ID: Cheyenne Rivera, female    DOB: 12/31/1980, 37 y.o.   MRN: [id number]  HPI   37 year old female presents for right foot pain x  4 months.  She reports  No fall, no injury, no change in activity. Pain is when she is up on feet for a while. Pain in posterior heel. Noted swelling in ankle diffusely.   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   Hx of plantar fasciitis.  Tried ice, exercies stretching. Changed shoes to more supportive shoes.  Has tried taping foot.  Using ibuprofen 800 mg every 8 hours.   Hx of tendon tear  7 year ago.   no fever, no flu like symptoms.  no new meds.  Review of Systems  Constitutional: Negative for fatigue and fever.  HENT: Negative for ear pain.   Eyes: Negative for pain.  Respiratory: Negative for chest tightness and shortness of breath.   Cardiovascular: Negative for chest pain, palpitations and leg swelling.  Gastrointestinal: Negative for abdominal pain.  Genitourinary: Negative for dysuria.       Objective:   Physical Exam  Constitutional: Vital signs are normal. She appears well-developed and well-nourished. She is cooperative.  Non-toxic appearance. She does not appear ill. No distress.  HENT:  Head: Normocephalic.  Right Ear: Hearing, tympanic membrane, external ear and ear canal normal. Tympanic membrane is not erythematous, not retracted and not bulging.  Left Ear: Hearing, tympanic membrane, external ear and ear canal normal. Tympanic membrane is not erythematous, not retracted and not bulging.  Nose: No mucosal edema or rhinorrhea. Right sinus exhibits no maxillary sinus tenderness and no frontal sinus tenderness. Left sinus exhibits no maxillary sinus tenderness and no frontal sinus tenderness.  Mouth/Throat: Uvula is midline, oropharynx is clear and moist and mucous membranes are normal.  Eyes: Conjunctivae, EOM and lids are normal. Pupils are equal, round, and reactive to light. Lids are  everted and swept, no foreign bodies found.  Neck: Trachea normal and normal range of motion. Neck supple. Carotid bruit is not present. No thyroid mass and no thyromegaly present.  Cardiovascular: Normal rate, regular rhythm, S1 normal, S2 normal, normal heart sounds, intact distal pulses and normal pulses. Exam reveals no gallop and no friction rub.  No murmur heard. Pulmonary/Chest: Effort normal and breath sounds normal. No tachypnea. No respiratory distress. She has no decreased breath sounds. She has no wheezes. She has no rhonchi. She has no rales.  Abdominal: Soft. Normal appearance and bowel sounds are normal. There is no tenderness.  Musculoskeletal:       Right ankle: She exhibits normal range of motion and no swelling. No tenderness. No lateral malleolus, no medial malleolus, no AITFL, no posterior TFL, no head of 5th metatarsal and no proximal fibula tenderness found. Achilles tendon normal. Achilles tendon exhibits no pain, no defect and normal Thompson's test results.  Current exam nml.. But points to achilles tendon insertion for area of pain and swelling  Neurological: She is alert.  Skin: Skin is warm, dry and intact. No rash noted.  Psychiatric: Her speech is normal and behavior is normal. Judgment and thought content normal. Her mood appears not anxious. Cognition and memory are normal. She does not exhibit a depressed mood.          Assessment & Plan:

## 2017-02-28 NOTE — Patient Instructions (Signed)
Stop ibuprofen. Start diclofenac twice daily.  Ice ankle. Start home PT. If not improving in 2 weeks follow up for further eval/X-ray etc.  Achilles Tendinitis Achilles tendinitis is inflammation of the tough, cord-like band that attaches the lower leg muscles to the heel bone (Achilles tendon). This is usually caused by overusing the tendon and the ankle joint. Achilles tendinitis usually gets better over time with treatment and caring for yourself at home. It can take weeks or months to heal completely. What are the causes? This condition may be caused by:  A sudden increase in exercise or activity, such as running.  Doing the same exercises or activities (such as jumping) over and over.  Not warming up calf muscles before exercising.  Exercising in shoes that are worn out or not made for exercise.  Having arthritis or a bone growth (spur) on the back of the heel bone. This can rub against the tendon and hurt it.  Age-related wear and tear. Tendons become less flexible with age and more likely to be injured.  What are the signs or symptoms? Common symptoms of this condition include:  Pain in the Achilles tendon or in the back of the leg, just above the heel. The pain usually gets worse with exercise.  Stiffness or soreness in the back of the leg, especially in the morning.  Swelling of the skin over the Achilles tendon.  Thickening of the tendon.  Bone spurs at the bottom of the Achilles tendon, near the heel.  Trouble standing on tiptoe.  How is this diagnosed? This condition is diagnosed based on your symptoms and a physical exam. You may have tests, including:  X-rays.  MRI.  How is this treated? The goal of treatment is to relieve symptoms and help your injury heal. Treatment may include:  Decreasing or stopping activities that caused the tendinitis. This may mean switching to low-impact exercises like biking or swimming.  Icing the injured area.  Doing  physical therapy, including strengthening and stretching exercises.  NSAIDs to help relieve pain and swelling.  Using supportive shoes, wraps, heel lifts, or a walking boot (air cast).  Surgery. This may be done if your symptoms do not improve after 6 months.  Using high-energy shock wave impulses to stimulate the healing process (extracorporeal shock wave therapy). This is rare.  Injection of medicines to help relieve inflammation (corticosteroids). This is rare.  Follow these instructions at home: If you have an air cast:  Wear the cast as told by your health care provider. Remove it only as told by your health care provider.  Loosen the cast if your toes tingle, become numb, or turn cold and blue. Activity  Gradually return to your normal activities once your health care provider approves. Do not do activities that cause pain. ? Consider doing low-impact exercises, like cycling or swimming.  If you have an air cast, ask your health care provider when it is safe for you to drive.  If physical therapy was prescribed, do exercises as told by your health care provider or physical therapist. Managing pain, stiffness, and swelling  Raise (elevate) your foot above the level of your heart while you are sitting or lying down.  Move your toes often to avoid stiffness and to lessen swelling.  If directed, put ice on the injured area: ? Put ice in a plastic bag. ? Place a towel between your skin and the bag. ? Leave the ice on for 20 minutes, 2-3 times a day  General instructions  If directed, wrap your foot with an elastic bandage or other wrap. This can help keep your tendon from moving too much while it heals. Your health care provider will show you how to wrap your foot correctly.  Wear supportive shoes or heel lifts only as told by your health care provider.  Take over-the-counter and prescription medicines only as told by your health care provider.  Keep all follow-up visits  as told by your health care provider. This is important. Contact a health care provider if:  You have symptoms that gets worse.  You have pain that does not get better with medicine.  You develop new, unexplained symptoms.  You develop warmth and swelling in your foot.  You have a fever. Get help right away if:  You have a sudden popping sound or sensation in your Achilles tendon followed by severe pain.  You cannot move your toes or foot.  You cannot put any weight on your foot. Summary  Achilles tendinitis is inflammation of the tough, cord-like band that attaches the lower leg muscles to the heel bone (Achilles tendon).  This condition is usually caused by overusing the tendon and the ankle joint. It can also be caused by arthritis or normal aging.  The most common symptoms of this condition include pain, swelling, or stiffness in the Achilles tendon or in the back of the leg.  This condition is usually treated with rest, NSAIDs, and physical therapy. This information is not intended to replace advice given to you by your health care provider. Make sure you discuss any questions you have with your health care provider. Document Released: 10/06/2004 Document Revised: 11/16/2015 Document Reviewed: 11/16/2015 Elsevier Interactive Patient Education  2017 [employer].

## 2017-03-16 ENCOUNTER — Ambulatory Visit: Payer: 59 | Admitting: Family Medicine

## 2017-03-21 ENCOUNTER — Ambulatory Visit (INDEPENDENT_AMBULATORY_CARE_PROVIDER_SITE_OTHER)
Admission: RE | Admit: 2017-03-21 | Discharge: 2017-03-21 | Disposition: A | Discharge status: Home / Self Care | Payer: 59 | Source: Ambulatory Visit | Attending: Family Medicine | Admitting: Family Medicine

## 2017-03-21 ENCOUNTER — Encounter: Payer: Self-pay | Admitting: Family Medicine

## 2017-03-21 ENCOUNTER — Ambulatory Visit: Payer: 59 | Admitting: Family Medicine

## 2017-03-21 DIAGNOSIS — G8929 Other chronic pain: Secondary | ICD-10-CM

## 2017-03-21 DIAGNOSIS — M79671 Pain in right foot: Secondary | ICD-10-CM | POA: Diagnosis not present

## 2017-03-21 MED ORDER — DICLOFENAC SODIUM 75 MG PO TBEC
75.0000 mg | DELAYED_RELEASE_TABLET | Freq: Two times a day (BID) | ORAL | 0 refills | Status: DC
Start: 2017-03-21 — End: 2017-05-01

## 2017-03-21 NOTE — Assessment & Plan Note (Signed)
Eval with X-ray. Continue ice , home PT and NSAIDs for now.

## 2017-03-21 NOTE — Progress Notes (Signed)
Subjective:    Patient ID: Cheyenne Rivera, female    DOB: 11/03/1980, 37 y.o.   MRN: [id number]  HPI    37 year old female pt of Dr. Patsy Lageropland presents with continued right heel and foot pain. Pain has been going on now about 5 months. No associated injury or fall. Tried ice, exercies stretching. Changed shoes to more supportive shoes.  Has tried taping foot.  Using ibuprofen 800 mg every 8 hours.  Seen on 2/19 by myself Dx with achilles tendonitis. Treated with diclofenac 75 mg BID, ice and home PT.  Today she reports she had some decrease in pain with diclofenac temporarily.  no redness, continues to have swelling diffusely in foot.. Notes shoes are tighter.  Pain is in heel shooting up back of leg , now pain radiates to lateral foot. Pain mainly when putting pressure  On foot and up on feet.  Blood pressure 110/70, pulse 82, temperature 98.4 F (36.9 C), temperature source Oral, height 5' 5.5" (1.664 m), weight 245 lb (111.1 kg).     Review of Systems  Constitutional: Negative for fatigue and fever.  HENT: Negative for congestion.   Eyes: Negative for pain.  Respiratory: Negative for cough and shortness of breath.   Cardiovascular: Negative for chest pain, palpitations and leg swelling.  Gastrointestinal: Negative for abdominal pain.  Genitourinary: Negative for dysuria and vaginal bleeding.  Musculoskeletal: Negative for back pain.  Neurological: Negative for syncope, light-headedness and headaches.  Psychiatric/Behavioral: Negative for dysphoric mood.       Objective:   Physical Exam  Constitutional: Vital signs are normal. She appears well-developed and well-nourished. She is cooperative.  Non-toxic appearance. She does not appear ill. No distress.  HENT:  Head: Normocephalic.  Right Ear: Hearing, tympanic membrane, external ear and ear canal normal. Tympanic membrane is not erythematous, not retracted and not bulging.  Left Ear: Hearing, tympanic membrane, external  ear and ear canal normal. Tympanic membrane is not erythematous, not retracted and not bulging.  Nose: No mucosal edema or rhinorrhea. Right sinus exhibits no maxillary sinus tenderness and no frontal sinus tenderness. Left sinus exhibits no maxillary sinus tenderness and no frontal sinus tenderness.  Mouth/Throat: Uvula is midline, oropharynx is clear and moist and mucous membranes are normal.  Eyes: Conjunctivae, EOM and lids are normal. Pupils are equal, round, and reactive to light. Lids are everted and swept, no foreign bodies found.  Neck: Trachea normal and normal range of motion. Neck supple. Carotid bruit is not present. No thyroid mass and no thyromegaly present.  Cardiovascular: Normal rate, regular rhythm, S1 normal, S2 normal, normal heart sounds, intact distal pulses and normal pulses. Exam reveals no gallop and no friction rub.  No murmur heard. Pulmonary/Chest: Effort normal and breath sounds normal. No tachypnea. No respiratory distress. She has no decreased breath sounds. She has no wheezes. She has no rhonchi. She has no rales.  Abdominal: Soft. Normal appearance and bowel sounds are normal. There is no tenderness.  Musculoskeletal:       Right ankle: She exhibits normal range of motion and no swelling. No tenderness. No lateral malleolus, no medial malleolus, no AITFL, no posterior TFL, no head of 5th metatarsal and no proximal fibula tenderness found. Achilles tendon normal. Achilles tendon exhibits no pain, no defect and normal Thompson's test results.   ttp at bacse of right 5th digit, ttp over heel  Diffusely, ttp over insertion of achilles tendon.  no ttp over insertion of plantar fascia. Positive  heel squeeze test.  Neurological: She is alert. She has normal strength. No sensory deficit. She displays a negative Romberg sign. Gait normal.  Skin: Skin is warm, dry and intact. No rash noted.  Psychiatric: Her speech is normal and behavior is normal. Judgment and thought content  normal. Her mood appears not anxious. Cognition and memory are normal. She does not exhibit a depressed mood.          Assessment & Plan:

## 2017-03-21 NOTE — Patient Instructions (Signed)
We will call with X-ray results.  Continue ice, home physical therapy and diclofenac for now.

## 2017-03-21 NOTE — Addendum Note (Signed)
Addended by: Kerby NoraBEDSOLE, Darrell Hauk E on: 03/21/2017 10:27 AM   Modules accepted: Orders

## 2017-03-21 NOTE — Assessment & Plan Note (Signed)
Eval with X-rays.

## 2017-04-20 ENCOUNTER — Ambulatory Visit: Payer: Self-pay

## 2017-04-20 ENCOUNTER — Ambulatory Visit: Payer: 59 | Admitting: Podiatry

## 2017-04-20 ENCOUNTER — Encounter: Payer: Self-pay | Admitting: Podiatry

## 2017-04-20 VITALS — BP 131/78 | HR 85

## 2017-04-20 DIAGNOSIS — M722 Plantar fascial fibromatosis: Secondary | ICD-10-CM | POA: Diagnosis not present

## 2017-04-20 DIAGNOSIS — R52 Pain, unspecified: Secondary | ICD-10-CM

## 2017-04-20 MED ORDER — MELOXICAM 7.5 MG PO TABS: 7.5000 mg | ORAL_TABLET | Freq: Every day | ORAL | 0 refills | Status: DC

## 2017-04-20 NOTE — Progress Notes (Signed)
This patient presents to the office with chief complaint of a painful right heel.  She walks with an antalgic gait into the treatment room.  She says that she started having plantar fascia pain in October 2018.  She says that she was seen by her primary care physician and diagnosed with Achilles tendinitis and plantar fasciitis.  She says that she was treated with 2 different anti-inflammatory medication to relieve her pain.  She says the pain has become so unbearable, she is unable to bear weight on her right heel.  She relates having pain upon rising in the morning and standing from a sitting position.  She says when she finishes a full days at work she has significant pain and swelling.  She presents the office today for continued evaluation and treatment of her painful right heel.  General Appearance  Alert, conversant and in no acute stress.  Vascular  Dorsalis pedis and posterior tibial  pulses are palpable  bilaterally.  Capillary return is within normal limits  bilaterally. Temperature is within normal limits  bilaterally.  Neurologic  Senn-Weinstein monofilament wire test within normal limits  bilaterally. Muscle power within normal limits bilaterally.  Nails normal nails noted with no evidence of bacterial or fungal infection.  Orthopedic  No limitations of motion of motion feet .  No crepitus or effusions noted.  No bony pathology or digital deformities noted. Palpable pain noted at the insertion of the plantar fascia right heel.  Swelling is noted in the right heel.    Skin  normotropic skin with no porokeratosis noted bilaterally.  No signs of infections or ulcers noted.    Acute plantar fasciitis right heel   IE.  X-rays were reviewed and she does have significant calcification at the insertion of the plantar fascia right heel.  Discussed this condition with this patient.  Discussed chronic plantar fasciitis versus acute plantar fasciitis.  We discussed injection therapy versus  immobilization.  She opted for injection therapy.  Injection therapy using 1.0 cc. Of 2% xylocaine( 20 mg.) plus 1 cc. of kenalog-la ( 10 mg) plus 1/2 cc. of dexamethazone phosphate ( 2 mg) .  Power step insoles was prescribed.  Prescribed Mobic 7.5 mg with instructions to takeaily.  RTC 10 days.   Helane GuntherGregory Aniyah Nobis DPM

## 2017-04-20 NOTE — Patient Instructions (Signed)

## 2017-05-01 ENCOUNTER — Encounter: Payer: Self-pay | Admitting: Family Medicine

## 2017-05-01 ENCOUNTER — Ambulatory Visit: Payer: 59 | Admitting: Podiatry

## 2017-05-01 ENCOUNTER — Other Ambulatory Visit: Payer: Self-pay

## 2017-05-01 ENCOUNTER — Ambulatory Visit: Payer: 59 | Admitting: Family Medicine

## 2017-05-01 VITALS — BP 118/80 | HR 83 | Temp 98.6°F | Ht 65.5 in | Wt 238.8 lb

## 2017-05-01 DIAGNOSIS — Z0000 Encounter for general adult medical examination without abnormal findings: Secondary | ICD-10-CM | POA: Diagnosis not present

## 2017-05-01 LAB — BASIC METABOLIC PANEL WITH GFR
BUN: 11 mg/dL (ref 6–23)
CO2: 25 meq/L (ref 19–32)
Calcium: 9.3 mg/dL (ref 8.4–10.5)
Chloride: 104 meq/L (ref 96–112)
Creatinine, Ser: 0.69 mg/dL (ref 0.40–1.20)
GFR: 101.49 mL/min
Glucose, Bld: 98 mg/dL (ref 70–99)
Potassium: 4.1 meq/L (ref 3.5–5.1)
Sodium: 139 meq/L (ref 135–145)

## 2017-05-01 LAB — CBC WITH DIFFERENTIAL/PLATELET
BASOS ABS: 0.1 10*3/uL (ref 0.0–0.1)
Basophils Relative: 0.8 % (ref 0.0–3.0)
EOS PCT: 1.3 % (ref 0.0–5.0)
Eosinophils Absolute: 0.1 10*3/uL (ref 0.0–0.7)
HEMATOCRIT: 44.5 % (ref 36.0–46.0)
HEMOGLOBIN: 15.1 g/dL — AB (ref 12.0–15.0)
LYMPHS ABS: 2.5 10*3/uL (ref 0.7–4.0)
LYMPHS PCT: 21.7 % (ref 12.0–46.0)
MCHC: 33.9 g/dL (ref 30.0–36.0)
MCV: 91.8 fl (ref 78.0–100.0)
MONOS PCT: 6 % (ref 3.0–12.0)
Monocytes Absolute: 0.7 10*3/uL (ref 0.1–1.0)
Neutro Abs: 8.2 10*3/uL — ABNORMAL HIGH (ref 1.4–7.7)
Neutrophils Relative %: 70.2 % (ref 43.0–77.0)
Platelets: 326 10*3/uL (ref 150.0–400.0)
RBC: 4.85 Mil/uL (ref 3.87–5.11)
RDW: 12.5 % (ref 11.5–15.5)
WBC: 11.6 10*3/uL — ABNORMAL HIGH (ref 4.0–10.5)

## 2017-05-01 LAB — HEPATIC FUNCTION PANEL
ALT: 21 U/L (ref 0–35)
AST: 16 U/L (ref 0–37)
Albumin: 4.2 g/dL (ref 3.5–5.2)
Alkaline Phosphatase: 51 U/L (ref 39–117)
Bilirubin, Direct: 0.1 mg/dL (ref 0.0–0.3)
Total Bilirubin: 0.5 mg/dL (ref 0.2–1.2)
Total Protein: 7 g/dL (ref 6.0–8.3)

## 2017-05-01 LAB — HEMOGLOBIN A1C: HEMOGLOBIN A1C: 5.9 % (ref 4.6–6.5)

## 2017-05-01 LAB — LIPID PANEL
Cholesterol: 133 mg/dL (ref 0–200)
HDL: 51.7 mg/dL
LDL Cholesterol: 65 mg/dL (ref 0–99)
NonHDL: 81.67
Total CHOL/HDL Ratio: 3
Triglycerides: 81 mg/dL (ref 0.0–149.0)
VLDL: 16.2 mg/dL (ref 0.0–40.0)

## 2017-05-01 LAB — TSH: TSH: 2.67 u[IU]/mL (ref 0.35–4.50)

## 2017-05-01 MED ORDER — RIZATRIPTAN BENZOATE 10 MG PO TABS: 10.0000 mg | ORAL_TABLET | ORAL | 5 refills | Status: DC | PRN

## 2017-05-01 MED ORDER — PROMETHAZINE HCL 25 MG PO TABS: 25.0000 mg | ORAL_TABLET | Freq: Four times a day (QID) | ORAL | 5 refills | Status: DC | PRN

## 2017-05-01 NOTE — Progress Notes (Signed)
Dr. Frederico Hamman T. Tiamarie Furnari, MD, [facility] [place], [address] Phone: [phone] Fax: [phone]  05/01/2017  Patient: Cheyenne Rivera, MRN: [id number], DOB: 01/04/1981, 37 y.o.  Primary Physician:  Owens Loffler, MD   Chief Complaint  Patient presents with  . Medication Refill  . Foot Pain    Right   Subjective:   Cheyenne Rivera is a 37 y.o. pleasant patient who presents with the following:  Health Maintenance Summary Reviewed and updated, unless pt declines services.  Tobacco History Reviewed. Non-smoker Alcohol: No concerns, no excessive use Exercise Habits: Some activity, rec at least 30 mins 5 times a week STD concerns: none Drug Use: None Birth control method: IUD Menses regular: yes Lumps or breast concerns: no Breast Cancer Family History: GM  Health Maintenance  Topic Date Due  . INFLUENZA VACCINE  08/10/2017  . PAP SMEAR  08/27/2017  . TETANUS/TDAP  01/13/2026  . HIV Screening  Completed    Immunization History  Administered Date(s) Administered  . Influenza-Unspecified 10/11/2014, 10/27/2015  . Tdap 01/14/2016   Patient Active Problem List   Diagnosis Date Noted  . Right Achilles tendinitis 02/28/2017  . IBS (irritable bowel syndrome) 01/15/2016  . Severe obesity (BMI >= 40) ([place]) 01/15/2016  . Allergic rhinitis 11/24/2015  . Migraine headache 08/22/2011  . Family history of breast cancer 06/09/2010  . SACROILIITIS, RIGHT 06/04/2009  . BACK PAIN 06/04/2009  . ANXIETY 05/05/2008  . DEPRESSION 05/05/2008   Past Medical History:  Diagnosis Date  . Anxiety   . Depression   . IBS (irritable bowel syndrome) 01/15/2016  . Migraine    Past Surgical History:  Procedure Laterality Date  . CHOLECYSTECTOMY    . INTRAUTERINE DEVICE INSERTION  03/2007   Mirena  . LEEP  3/03  . TONSILLECTOMY     Social History   Socioeconomic History  . Marital status: Single    Spouse name: Not on  file  . Number of children: 2  . Years of education: Not on file  . Highest education level: Not on file  Occupational History  . Occupation: Art therapist    Comment: [employer]  . Financial resource strain: Not on file  . Food insecurity:    Worry: Not on file    Inability: Not on file  . Transportation needs:    Medical: Not on file    Non-medical: Not on file  Tobacco Use  . Smoking status: Never Smoker  . Smokeless tobacco: Never Used  Substance and Sexual Activity  . Alcohol use: Yes    Alcohol/week: 0.0 oz    Comment: rare  . Drug use: No  . Sexual activity: Yes    Birth control/protection: IUD    Comment: mirena  Lifestyle  . Physical activity:    Days per week: Not on file    Minutes per session: Not on file  . Stress: Not on file  Relationships  . Social connections:    Talks on phone: Not on file    Gets together: Not on file    Attends religious service: Not on file    Active member of club or organization: Not on file    Attends meetings of clubs or organizations: Not on file    Relationship status: Not on file  . Intimate partner violence:    Fear of current or ex partner: Not on file    Emotionally abused: Not  on file    Physically abused: Not on file    Forced sexual activity: Not on file  Other Topics Concern  . Not on file  Social History Narrative  . Not on file   Family History  Problem Relation Age of Onset  . Alcohol abuse Father   . Breast cancer Unknown        Grandmother  . Sudden death Unknown        >50, [place] father, ?cause  . Colon cancer Neg Hx   . Ovarian cancer Neg Hx   . Lung cancer Neg Hx   . Prostate cancer Neg Hx   . Coronary artery disease Neg Hx   . Stroke Neg Hx   . Diabetes Neg Hx   . Mental illness Neg Hx    Allergies  Allergen Reactions  . Codeine Other (See Comments)  . Morphine     REACTION: rash and nausea    Medication list has been reviewed and updated.   General: Denies  fever, chills, sweats. No significant weight loss. Eyes: Denies blurring,significant itching ENT: Denies earache, sore throat, and hoarseness.  Cardiovascular: Denies chest pains, palpitations, dyspnea on exertion,  Respiratory: Denies cough, dyspnea at rest,wheeezing Breast: no concerns about lumps GI: Denies nausea, vomiting, diarrhea, constipation, change in bowel habits, abdominal pain, melena, hematochezia GU: Denies dysuria, hematuria, urinary hesitancy, nocturia, denies STD risk, no concerns about discharge Musculoskeletal: Denies back pain, joint pain Derm: Denies rash, itching Neuro: Denies  paresthesias, frequent falls, frequent headaches Psych: Denies depression, anxiety Endocrine: Denies cold intolerance, heat intolerance, polydipsia Heme: Denies enlarged lymph nodes Allergy: No hayfever  Objective:   BP 118/80   Pulse 83   Temp 98.6 F (37 C) (Oral)   Ht 5' 5.5" (1.664 m)   Wt 238 lb 12 oz (108.3 kg)   BMI 39.13 kg/m  No exam data present  GEN: well developed, well nourished, no acute distress Eyes: conjunctiva and lids normal, PERRLA, EOMI ENT: TM clear, nares clear, oral exam WNL Neck: supple, no lymphadenopathy, no thyromegaly, no JVD Pulm: clear to auscultation and percussion, respiratory effort normal CV: regular rate and rhythm, S1-S2, no murmur, rub or gallop, no bruits Chest: no scars, masses, no lumps BREAST: breast exam normal. Chaperoned examination. Entirety of breast examined including axilla, and no enlarged lymph nodes. No significant masses are felt. No nipple discharge. No skin changes. Overall, reassuring breast exam.  This portion of the physical examination was chaperoned by Hedy Camara, CMA.  GI: soft, non-tender; no hepatosplenomegaly, masses; active bowel sounds all quadrants GU: GU exam declined Lymph: no cervical, axillary or inguinal adenopathy MSK: gait normal, muscle tone and strength WNL, no joint swelling, effusions, discoloration,  crepitus  SKIN: clear, good turgor, color WNL, no rashes, lesions, or ulcerations Neuro: normal mental status, normal strength, sensation, and motion Psych: alert; oriented to person, place and time, normally interactive and not anxious or depressed in appearance.   All labs reviewed with patient. Lipids:    Component Value Date/Time   CHOL 133 05/01/2017 0901   TRIG 81.0 05/01/2017 0901   HDL 51.70 05/01/2017 0901   LDLDIRECT 72.7 06/04/2009 1626   VLDL 16.2 05/01/2017 0901   CHOLHDL 3 05/01/2017 0901   CBC: CBC Latest Ref Rng & Units 05/01/2017 01/26/2016 01/19/2016  WBC 4.0 - 10.5 K/uL 11.6(H) 10.5 13.8(H)  Hemoglobin 12.0 - 15.0 g/dL 15.1(H) 14.5 14.5  Hematocrit 36.0 - 46.0 % 44.5 42.7 42.6  Platelets 150.0 - 400.0 K/uL 326.0  293.0 762    Basic Metabolic Panel:    Component Value Date/Time   NA 139 05/01/2017 0901   NA 142 03/24/2012 0545   K 4.1 05/01/2017 0901   K 3.5 03/24/2012 0545   CL 104 05/01/2017 0901   CL 110 (H) 03/24/2012 0545   CO2 25 05/01/2017 0901   CO2 29 03/24/2012 0545   BUN 11 05/01/2017 0901   BUN 9 03/24/2012 0545   CREATININE 0.69 05/01/2017 0901   CREATININE 0.70 03/24/2012 0545   GLUCOSE 98 05/01/2017 0901   GLUCOSE 108 (H) 03/24/2012 0545   CALCIUM 9.3 05/01/2017 0901   CALCIUM 8.0 (L) 03/24/2012 0545   Hepatic Function Latest Ref Rng & Units 05/01/2017 01/26/2016 08/04/2014  Total Protein 6.0 - 8.3 g/dL 7.0 7.0 7.1  Albumin 3.5 - 5.2 g/dL 4.2 4.5 4.4  AST 0 - 37 U/L '16 25 30  ' ALT 0 - 35 U/L 21 44(H) 38(H)  Alk Phosphatase 39 - 117 U/L 51 42 44  Total Bilirubin 0.2 - 1.2 mg/dL 0.5 0.5 0.3  Bilirubin, Direct 0.0 - 0.3 mg/dL 0.1 0.1 0.1    Lab Results  Component Value Date   TSH 2.67 05/01/2017   No results found.  Assessment and Plan:   Healthcare maintenance - Plan: Basic metabolic panel, CBC with Differential/Platelet, Hepatic function panel, Hemoglobin A1c, Lipid panel, TSH  Health Maintenance Exam: The patient's preventative  maintenance and recommended screening tests for an annual wellness exam were reviewed in full today. Brought up to date unless services declined.  Counselled on the importance of diet, exercise, and its role in overall health and mortality. The patient's FH and SH was reviewed, including their home life, tobacco status, and drug and alcohol status.  Follow-up in 1 year for physical exam or additional follow-up below.  Follow-up: No follow-ups on file. Or follow-up in 1 year if not noted.  Meds ordered this encounter  Medications  . rizatriptan (MAXALT) 10 MG tablet    Sig: Take 1 tablet (10 mg total) by mouth as needed for migraine. May repeat in 2 hours if needed    Dispense:  10 tablet    Refill:  5  . promethazine (PHENERGAN) 25 MG tablet    Sig: Take 1 tablet (25 mg total) by mouth every 6 (six) hours as needed.    Dispense:  30 tablet    Refill:  5   Orders Placed This Encounter  Procedures  . Basic metabolic panel  . CBC with Differential/Platelet  . Hepatic function panel  . Hemoglobin A1c  . Lipid panel  . TSH    Signed,  Frederico Hamman T. Tanaysha Alkins, MD   Allergies as of 05/01/2017      Reactions   Codeine Other (See Comments)   Morphine    REACTION: rash and nausea      Medication List        Accurate as of 05/01/17  2:03 PM. Always use your most recent med list.          levonorgestrel 20 MCG/24HR IUD Commonly known as:  MIRENA 1 Intra Uterine Device (1 each total) by Intrauterine route once.   promethazine 25 MG tablet Commonly known as:  PHENERGAN Take 1 tablet (25 mg total) by mouth every 6 (six) hours as needed.   rizatriptan 10 MG tablet Commonly known as:  MAXALT Take 1 tablet (10 mg total) by mouth as needed for migraine. May repeat in 2 hours if needed

## 2017-05-01 NOTE — Patient Instructions (Signed)
Please read handouts on Plantar Fascitis.  STRETCHING and Strengthening program critically important.  Strengthening on foot and calf muscles as seen in handout. Calf raises, 2 legged, then 1 legged. Foot massage with tennis ball. Ice massage.  Towel Scrunches: get a towel or hand towel, use toes to pick up and scrunch up the towel.  Marble pick-ups, practice picking up marbles with toes and placing into a cup  NEEDS TO BE DONE EVERY DAY  Recommended over the counter insoles. (Spenco or Hapad)  A rigid shoe with good arch support helps: Dansko (great), Randel PiggKeen, Merrell No easily bendable shoes. Like Birks  Tuli's heel cups

## 2017-06-13 ENCOUNTER — Encounter: Payer: Self-pay | Admitting: Primary Care

## 2017-06-13 ENCOUNTER — Ambulatory Visit: Payer: 59 | Admitting: Internal Medicine

## 2017-06-13 ENCOUNTER — Ambulatory Visit: Payer: 59 | Admitting: Primary Care

## 2017-06-13 VITALS — BP 118/76 | HR 90 | Temp 98.1°F | Ht 65.5 in | Wt 241.0 lb

## 2017-06-13 DIAGNOSIS — L03313 Cellulitis of chest wall: Secondary | ICD-10-CM

## 2017-06-13 MED ORDER — CEPHALEXIN 500 MG PO CAPS: 500.0000 mg | ORAL_CAPSULE | Freq: Three times a day (TID) | ORAL | 0 refills | Status: DC

## 2017-06-13 NOTE — Progress Notes (Signed)
Subjective:    Patient ID: Cheyenne Rivera, female    DOB: 07/21/1980, 37 y.o.   MRN: [id number]  HPI  Cheyenne Rivera is a 37 year old female who presents today with a chief complaint of breast mass.   Her mass is located to the left breast for which she noticed this past weekend. The mass has a burning sensation with tenderness upon palpation. She thinks the mass has enlarged and has become red. She underwent a breast exam with her PCP in April 2019 which was unremarkable.   She denies fevers, changes in skin texture. Family history of breast cancer in maternal grandmother. Her mother has a history of breast cysts and lumps that have been removed which were benign. She denies fevers, open wounds, increased stress, tight fitting clothes.   Review of Systems  Constitutional: Negative for fever.  Skin: Positive for color change.       Past Medical History:  Diagnosis Date  . Anxiety   . Depression   . IBS (irritable bowel syndrome) 01/15/2016  . Migraine      Social History   Socioeconomic History  . Marital status: Single    Spouse name: Not on file  . Number of children: 2  . Years of education: Not on file  . Highest education level: Not on file  Occupational History  . Occupation: Sales executivedental assistant    Comment: [employer]  Social Needs  . Financial resource strain: Not on file  . Food insecurity:    Worry: Not on file    Inability: Not on file  . Transportation needs:    Medical: Not on file    Non-medical: Not on file  Tobacco Use  . Smoking status: Never Smoker  . Smokeless tobacco: Never Used  Substance and Sexual Activity  . Alcohol use: Yes    Alcohol/week: 0.0 oz    Comment: rare  . Drug use: No  . Sexual activity: Yes    Birth control/protection: IUD    Comment: mirena  Lifestyle  . Physical activity:    Days per week: Not on file    Minutes per session: Not on file  . Stress: Not on file  Relationships  . Social connections:    Talks on phone:  Not on file    Gets together: Not on file    Attends religious service: Not on file    Active member of club or organization: Not on file    Attends meetings of clubs or organizations: Not on file    Relationship status: Not on file  . Intimate partner violence:    Fear of current or ex partner: Not on file    Emotionally abused: Not on file    Physically abused: Not on file    Forced sexual activity: Not on file  Other Topics Concern  . Not on file  Social History Narrative  . Not on file    Past Surgical History:  Procedure Laterality Date  . CHOLECYSTECTOMY    . INTRAUTERINE DEVICE INSERTION  03/2007   Mirena  . LEEP  3/03  . TONSILLECTOMY      Family History  Problem Relation Age of Onset  . Alcohol abuse Father   . Breast cancer Unknown        Grandmother  . Sudden death Unknown        >50, Grand father, ?cause  . Colon cancer Neg Hx   . Ovarian cancer Neg Hx   .  Lung cancer Neg Hx   . Prostate cancer Neg Hx   . Coronary artery disease Neg Hx   . Stroke Neg Hx   . Diabetes Neg Hx   . Mental illness Neg Hx     Allergies  Allergen Reactions  . Codeine Other (See Comments)  . Morphine     REACTION: rash and nausea    Current Outpatient Medications on File Prior to Visit  Medication Sig Dispense Refill  . levonorgestrel (MIRENA) 20 MCG/24HR IUD 1 Intra Uterine Device (1 each total) by Intrauterine route once. 1 each 0  . promethazine (PHENERGAN) 25 MG tablet Take 1 tablet (25 mg total) by mouth every 6 (six) hours as needed. 30 tablet 5  . rizatriptan (MAXALT) 10 MG tablet Take 1 tablet (10 mg total) by mouth as needed for migraine. May repeat in 2 hours if needed 10 tablet 5   No current facility-administered medications on file prior to visit.     BP 118/76   Pulse 90   Temp 98.1 F (36.7 C) (Oral)   Ht 5' 5.5" (1.664 m)   Wt 241 lb (109.3 kg)   SpO2 98%   BMI 39.49 kg/m    Objective:   Physical Exam  Cardiovascular: Normal rate.    Respiratory: Effort normal.    4X5 cm firm subcutaneous mass to left breast at 3 o'clock position. Tender. Mild erythema. No open skin or wound.  Skin: Skin is warm and dry. There is erythema.           Assessment & Plan:  Cellulitis:  Breast mass with tenderness x 4 days, increased over last 24 hours. Exam today with obvious cellulitis, no obvious entry wound/cause. Rx for Cephalexin TID course x 10 days sent to pharmacy. Discussed home care instructions. Follow up in 3 days for re-evaluation.   Cheyenne Nest, NP

## 2017-06-13 NOTE — Patient Instructions (Addendum)
Start cephalexin 500 mg antibiotics for infection. Take 1 capsule by mouth three times daily for 10 days.  Please schedule a follow up visit for Friday this week.   It was a pleasure meeting you!

## 2017-06-14 ENCOUNTER — Ambulatory Visit: Payer: 59 | Admitting: Family Medicine

## 2017-06-16 ENCOUNTER — Encounter: Payer: Self-pay | Admitting: Primary Care

## 2017-06-16 ENCOUNTER — Ambulatory Visit: Payer: 59 | Admitting: Primary Care

## 2017-06-16 VITALS — BP 126/78 | HR 81 | Temp 98.1°F | Wt 239.5 lb

## 2017-06-16 DIAGNOSIS — L03313 Cellulitis of chest wall: Secondary | ICD-10-CM

## 2017-06-16 NOTE — Patient Instructions (Signed)
Continue cephalexin 500 mg capsules three times daily for infection.  Please call me if your symptoms do not continue to improve.  It was a pleasure to see you today!

## 2017-06-16 NOTE — Progress Notes (Signed)
Subjective:    Patient ID: Cheyenne Rivera, female    DOB: 1980-12-16, 37 y.o.   MRN: [id number]  HPI  Cheyenne Rivera is a 37 year old female who presents today for follow up of abscess/cellulitis.  She was last evaluated on 06/13/17 with complaints of left breast pain and tenderness. She was noted to have cellulitis with potential subcutaneous abscess and was placed on Cephalexin 500 mg TID.   Since her last visit she's feeling overall fatigued but has noticed improvement to the color and tenderness of her left breast. The cephalexin has caused her to feel nauseated but this is tolerable. She denies fevers, increased pain, increased swelling.   Review of Systems  Constitutional: Negative for fever.  Skin:       Decreased erythema. Decreased size of abscess       Past Medical History:  Diagnosis Date  . Anxiety   . Depression   . IBS (irritable bowel syndrome) 01/15/2016  . Migraine      Social History   Socioeconomic History  . Marital status: Single    Spouse name: Not on file  . Number of children: 2  . Years of education: Not on file  . Highest education level: Not on file  Occupational History  . Occupation: Sales executive    Comment: [employer]  Social Needs  . Financial resource strain: Not on file  . Food insecurity:    Worry: Not on file    Inability: Not on file  . Transportation needs:    Medical: Not on file    Non-medical: Not on file  Tobacco Use  . Smoking status: Never Smoker  . Smokeless tobacco: Never Used  Substance and Sexual Activity  . Alcohol use: Yes    Alcohol/week: 0.0 oz    Comment: rare  . Drug use: No  . Sexual activity: Yes    Birth control/protection: IUD    Comment: mirena  Lifestyle  . Physical activity:    Days per week: Not on file    Minutes per session: Not on file  . Stress: Not on file  Relationships  . Social connections:    Talks on phone: Not on file    Gets together: Not on file    Attends religious  service: Not on file    Active member of club or organization: Not on file    Attends meetings of clubs or organizations: Not on file    Relationship status: Not on file  . Intimate partner violence:    Fear of current or ex partner: Not on file    Emotionally abused: Not on file    Physically abused: Not on file    Forced sexual activity: Not on file  Other Topics Concern  . Not on file  Social History Narrative  . Not on file    Past Surgical History:  Procedure Laterality Date  . CHOLECYSTECTOMY    . INTRAUTERINE DEVICE INSERTION  03/2007   Mirena  . LEEP  3/03  . TONSILLECTOMY      Family History  Problem Relation Age of Onset  . Alcohol abuse Father   . Breast cancer Unknown        Grandmother  . Sudden death Unknown        >50, Grand father, ?cause  . Colon cancer Neg Hx   . Ovarian cancer Neg Hx   . Lung cancer Neg Hx   . Prostate cancer Neg Hx   .  Coronary artery disease Neg Hx   . Stroke Neg Hx   . Diabetes Neg Hx   . Mental illness Neg Hx     Allergies  Allergen Reactions  . Codeine Other (See Comments)  . Morphine     REACTION: rash and nausea    Current Outpatient Medications on File Prior to Visit  Medication Sig Dispense Refill  . cephALEXin (KEFLEX) 500 MG capsule Take 1 capsule (500 mg total) by mouth 3 (three) times daily. 30 capsule 0  . levonorgestrel (MIRENA) 20 MCG/24HR IUD 1 Intra Uterine Device (1 each total) by Intrauterine route once. 1 each 0  . promethazine (PHENERGAN) 25 MG tablet Take 1 tablet (25 mg total) by mouth every 6 (six) hours as needed. 30 tablet 5  . rizatriptan (MAXALT) 10 MG tablet Take 1 tablet (10 mg total) by mouth as needed for migraine. May repeat in 2 hours if needed 10 tablet 5   No current facility-administered medications on file prior to visit.     BP 126/78   Pulse 81   Temp 98.1 F (36.7 C) (Oral)   Wt 239 lb 8 oz (108.6 kg)   SpO2 96%   BMI 39.25 kg/m    Objective:   Physical Exam    Constitutional: She appears well-nourished.  Cardiovascular: Normal rate.  Respiratory:    Decrease in size of subcutaneous mass. Non tender. Mass measuring 2 cm in length and 0.5 cm in width.  Skin: Skin is warm and dry. No erythema.           Assessment & Plan:  Cellulitis/Skin Mass:  Evaluated on 06/13/17, noted to have subcutaneous infection. Exam today with moderate improvement since initiation of cephalexin. Continue Cephalexin. Work note provided for her to return home and rest. Strict return precautions provided.  Doreene NestKatherine K Pennelope Basque, NP

## 2017-12-30 ENCOUNTER — Other Ambulatory Visit: Payer: Self-pay | Admitting: Family Medicine

## 2018-01-05 ENCOUNTER — Encounter: Payer: Self-pay | Admitting: Internal Medicine

## 2018-01-05 ENCOUNTER — Ambulatory Visit: Payer: 59 | Admitting: Internal Medicine

## 2018-01-05 VITALS — BP 120/80 | HR 97 | Temp 98.2°F | Wt 241.0 lb

## 2018-01-05 DIAGNOSIS — J00 Acute nasopharyngitis [common cold]: Secondary | ICD-10-CM | POA: Diagnosis not present

## 2018-01-05 MED ORDER — PROMETHAZINE-DM 6.25-15 MG/5ML PO SYRP: 5.0000 mL | ORAL_SOLUTION | Freq: Four times a day (QID) | ORAL | 0 refills | Status: DC | PRN

## 2018-01-05 MED ORDER — AZITHROMYCIN 250 MG PO TABS: ORAL_TABLET | ORAL | 0 refills | Status: DC

## 2018-01-05 NOTE — Progress Notes (Signed)
HPI  Pt presents to the clinic today with c/o ear pain, sore throat and cough. She reports this started 2 week ago, but worse in the last 2-3 days. She describes the ear pain as fullness, without loss of hearing, drainage. She denies difficulty swallowing. The cough is nonproductive. She denies runny nose, nasal congestion or shortness of breath. She denies fever, chills or body aches. She has tried Delsym, Nasonex, Nyquil, Mucinex and Tessalon with some relief. She has a history of allergies. She has not had sick contacts.  Review of Systems      Past Medical History:  Diagnosis Date  . Anxiety   . Depression   . IBS (irritable bowel syndrome) 01/15/2016  . Migraine     Family History  Problem Relation Age of Onset  . Alcohol abuse Father   . Breast cancer Unknown        Grandmother  . Sudden death Unknown        >50, Grand father, ?cause  . Colon cancer Neg Hx   . Ovarian cancer Neg Hx   . Lung cancer Neg Hx   . Prostate cancer Neg Hx   . Coronary artery disease Neg Hx   . Stroke Neg Hx   . Diabetes Neg Hx   . Mental illness Neg Hx     Social History   Socioeconomic History  . Marital status: Single    Spouse name: Not on file  . Number of children: 2  . Years of education: Not on file  . Highest education level: Not on file  Occupational History  . Occupation: Sales executivedental assistant    Comment: [employer]  Social Needs  . Financial resource strain: Not on file  . Food insecurity:    Worry: Not on file    Inability: Not on file  . Transportation needs:    Medical: Not on file    Non-medical: Not on file  Tobacco Use  . Smoking status: Never Smoker  . Smokeless tobacco: Never Used  Substance and Sexual Activity  . Alcohol use: Yes    Alcohol/week: 0.0 standard drinks    Comment: rare  . Drug use: No  . Sexual activity: Yes    Birth control/protection: I.U.D.    Comment: mirena  Lifestyle  . Physical activity:    Days per week: Not on file    Minutes per  session: Not on file  . Stress: Not on file  Relationships  . Social connections:    Talks on phone: Not on file    Gets together: Not on file    Attends religious service: Not on file    Active member of club or organization: Not on file    Attends meetings of clubs or organizations: Not on file    Relationship status: Not on file  . Intimate partner violence:    Fear of current or ex partner: Not on file    Emotionally abused: Not on file    Physically abused: Not on file    Forced sexual activity: Not on file  Other Topics Concern  . Not on file  Social History Narrative  . Not on file    Allergies  Allergen Reactions  . Codeine Other (See Comments)  . Morphine     REACTION: rash and nausea     Constitutional:  Denies headache, fatigue, fever or abrupt weight changes.  HEENT:  Positive ear fullness, sore throat. Denies eye redness, eye pain, pressure behind the eyes, facial  pain, nasal congestion, ear pain, ringing in the ears, wax buildup, runny nose or bloody nose. Respiratory: Positive cough. Denies difficulty breathing or shortness of breath.  Cardiovascular: Denies chest pain, chest tightness, palpitations or swelling in the hands or feet.   No other specific complaints in a complete review of systems (except as listed in HPI above).  Objective:   BP 120/80   Pulse 97   Temp 98.2 F (36.8 C) (Oral)   Wt 241 lb (109.3 kg)   SpO2 97%   BMI 39.49 kg/m   Wt Readings from Last 3 Encounters:  06/16/17 239 lb 8 oz (108.6 kg)  06/13/17 241 lb (109.3 kg)  05/01/17 238 lb 12 oz (108.3 kg)     General: Appears her stated age, well developed, well nourished in NAD. HEENT: Head: normal shape and size, no sinus tenderness noted;  Ears: Tm's gray and intact, normal light reflex; Nose: mucosa pink and moist, septum midline; Throat/Mouth: + PND. Teeth present, mucosa pink and moist, no exudate noted, no lesions or ulcerations noted.  Neck: No cervical lymphadenopathy.   Cardiovascular: Normal rate and rhythm. S1,S2 noted.  No murmur, rubs or gallops noted.  Pulmonary/Chest: Normal effort and positive vesicular breath sounds. No respiratory distress. No wheezes, rales or ronchi noted.       Assessment & Plan:   Upper Respiratory Infection:  Get some rest and drink plenty of water Do salt water gargles for the sore throat eRx for Azithromax x 5 days eRx for Promethazine DM cough syrup  RTC as needed or if symptoms persist.   Nicki Reaperegina Baity, NP

## 2018-01-05 NOTE — Patient Instructions (Signed)

## 2018-01-05 NOTE — Addendum Note (Signed)
Addended by: Roena MaladyEVONTENNO, MELANIE Y on: 01/05/2018 05:09 PM   Modules accepted: Orders

## 2018-01-08 ENCOUNTER — Encounter: Payer: Self-pay | Admitting: Internal Medicine

## 2018-01-08 MED ORDER — CEFUROXIME AXETIL 500 MG PO TABS: 500.0000 mg | ORAL_TABLET | Freq: Two times a day (BID) | ORAL | 0 refills | Status: DC

## 2018-02-27 ENCOUNTER — Other Ambulatory Visit (HOSPITAL_COMMUNITY)
Admission: RE | Admit: 2018-02-27 | Discharge: 2018-02-27 | Disposition: A | Discharge status: Home / Self Care | Payer: 59 | Source: Ambulatory Visit | Attending: Obstetrics and Gynecology | Admitting: Obstetrics and Gynecology

## 2018-02-27 ENCOUNTER — Ambulatory Visit (INDEPENDENT_AMBULATORY_CARE_PROVIDER_SITE_OTHER): Payer: 59 | Admitting: Obstetrics and Gynecology

## 2018-02-27 ENCOUNTER — Encounter: Payer: Self-pay | Admitting: Obstetrics and Gynecology

## 2018-02-27 VITALS — BP 129/101 | HR 117 | Ht 65.0 in | Wt 226.9 lb

## 2018-02-27 DIAGNOSIS — Z01419 Encounter for gynecological examination (general) (routine) without abnormal findings: Secondary | ICD-10-CM | POA: Diagnosis not present

## 2018-02-27 DIAGNOSIS — E559 Vitamin D deficiency, unspecified: Secondary | ICD-10-CM | POA: Diagnosis not present

## 2018-02-27 DIAGNOSIS — Z6837 Body mass index (BMI) 37.0-37.9, adult: Secondary | ICD-10-CM | POA: Diagnosis not present

## 2018-02-27 DIAGNOSIS — Z30431 Encounter for routine checking of intrauterine contraceptive device: Secondary | ICD-10-CM

## 2018-02-27 DIAGNOSIS — Z113 Encounter for screening for infections with a predominantly sexual mode of transmission: Secondary | ICD-10-CM

## 2018-02-27 NOTE — Progress Notes (Signed)
Subjective:   Cheyenne Rivera is a 38 y.o. G65P0 Caucasian female here for a routine well-woman exam.  No LMP recorded. (Menstrual status: IUD).    Current complaints: request STD screening, no symptoms, just had multiple partners over last year and desires testing. PCP: [facility]       does desire labs  Social History: Sexual: heterosexual Marital Status: divorced Living situation: with 29 & 39 yo daughters Occupation: Sales executive Tobacco/alcohol: no tobacco use Illicit drugs: no history of illicit drug use  The following portions of the patient's history were reviewed and updated as appropriate: allergies, current medications, past family history, past medical history, past social history, past surgical history and problem list.  Past Medical History Past Medical History:  Diagnosis Date  . Anxiety   . Depression   . IBS (irritable bowel syndrome) 01/15/2016  . Migraine     Past Surgical History Past Surgical History:  Procedure Laterality Date  . CHOLECYSTECTOMY    . INTRAUTERINE DEVICE INSERTION  03/2007   Mirena  . LEEP  3/03  . TONSILLECTOMY      Gynecologic History G2P0  No LMP recorded. (Menstrual status: IUD). Contraception: IUD (placed 10/16) Last Pap: 08/2014. Results were: normal Last mammogram: never had one  Obstetric History OB History  Gravida Para Term Preterm AB Living  2         2  SAB TAB Ectopic Multiple Live Births          2    # Outcome Date GA Lbr Len/2nd Weight Sex Delivery Anes PTL Lv  2 Gravida 2009    F Vag-Spont   LIV  1 Gravida 2002    F Vag-Spont   LIV    Current Medications Current Outpatient Medications on File Prior to Visit  Medication Sig Dispense Refill  . levonorgestrel (MIRENA) 20 MCG/24HR IUD 1 Intra Uterine Device (1 each total) by Intrauterine route once. 1 each 0  . promethazine (PHENERGAN) 25 MG tablet Take 1 tablet (25 mg total) by mouth every 6 (six) hours as needed. 30 tablet 5  . rizatriptan (MAXALT) 10 MG  tablet Take 1 tablet (10 mg total) by mouth as needed for migraine. May repeat in 2 hours if needed 10 tablet 5  . azithromycin (ZITHROMAX) 250 MG tablet Take 2 tabs today, then 1 tab daily x 4 days (Patient not taking: Reported on 02/27/2018) 6 tablet 0  . cefUROXime (CEFTIN) 500 MG tablet Take 1 tablet (500 mg total) by mouth 2 (two) times daily with a meal. (Patient not taking: Reported on 02/27/2018) 20 tablet 0  . promethazine-dextromethorphan (PROMETHAZINE-DM) 6.25-15 MG/5ML syrup Take 5 mLs by mouth 4 (four) times daily as needed. (Patient not taking: Reported on 02/27/2018) 118 mL 0   No current facility-administered medications on file prior to visit.     Review of Systems Patient denies any headaches, blurred vision, shortness of breath, chest pain, abdominal pain, problems with bowel movements, urination, or intercourse.  Objective:  BP (!) 129/101   Pulse (!) 117   Ht 5\' 5"  (1.651 m)   Wt 226 lb 14.4 oz (102.9 kg)   BMI 37.76 kg/m  Physical Exam  General:  Well developed, well nourished, no acute distress. She is alert and oriented x3. Skin:  Warm and dry Neck:  Midline trachea, no thyromegaly or nodules Cardiovascular: Regular rate and rhythm, no murmur heard Lungs:  Effort normal, all lung fields clear to auscultation bilaterally Breasts:  No dominant palpable mass, retraction,  or nipple discharge Abdomen:  Soft, non tender, no hepatosplenomegaly or masses Pelvic:  External genitalia is normal in appearance.  The vagina is normal in appearance. The cervix is bulbous, no CMT. IUD string is noted.  Thin prep pap is done with HR HPV cotesting. Uterus is felt to be normal size, shape, and contour.  No adnexal masses or tenderness noted. Extremities:  No swelling or varicosities noted Psych:  She has a normal mood and affect  Assessment:   Healthy well-woman exam IUD check Vit D deficiency BMI 37 Request STD screening  Plan:  ;abs obtained -will follow up accordingly F/U  1 year for AE, or sooner if needed Mammogram baseline needed  Cheyenne Rivera Cheyenne Rivera, CNM

## 2018-02-27 NOTE — Patient Instructions (Addendum)
 Preventive Care 18-39 Years, Female Preventive care refers to lifestyle choices and visits with your health care provider that can promote health and wellness. What does preventive care include?   A yearly physical exam. This is also called an annual well check.  Dental exams once or twice a year.  Routine eye exams. Ask your health care provider how often you should have your eyes checked.  Personal lifestyle choices, including: ? Daily care of your teeth and gums. ? Regular physical activity. ? Eating a healthy diet. ? Avoiding tobacco and drug use. ? Limiting alcohol use. ? Practicing safe sex. ? Taking vitamin and mineral supplements as recommended by your health care provider. What happens during an annual well check? The services and screenings done by your health care provider during your annual well check will depend on your age, overall health, lifestyle risk factors, and family history of disease. Counseling Your health care provider may ask you questions about your:  Alcohol use.  Tobacco use.  Drug use.  Emotional well-being.  Home and relationship well-being.  Sexual activity.  Eating habits.  Work and work environment.  Method of birth control.  Menstrual cycle.  Pregnancy history. Screening You may have the following tests or measurements:  Height, weight, and BMI.  Diabetes screening. This is done by checking your blood sugar (glucose) after you have not eaten for a while (fasting).  Blood pressure.  Lipid and cholesterol levels. These may be checked every 5 years starting at age 20.  Skin check.  Hepatitis C blood test.  Hepatitis B blood test.  Sexually transmitted disease (STD) testing.  BRCA-related cancer screening. This may be done if you have a family history of breast, ovarian, tubal, or peritoneal cancers.  Pelvic exam and Pap test. This may be done every 3 years starting at age 21. Starting at age 30, this may be done  every 5 years if you have a Pap test in combination with an HPV test. Discuss your test results, treatment options, and if necessary, the need for more tests with your health care provider. Vaccines Your health care provider may recommend certain vaccines, such as:  Influenza vaccine. This is recommended every year.  Tetanus, diphtheria, and acellular pertussis (Tdap, Td) vaccine. You may need a Td booster every 10 years.  Varicella vaccine. You may need this if you have not been vaccinated.  HPV vaccine. If you are 26 or younger, you may need three doses over 6 months.  Measles, mumps, and rubella (MMR) vaccine. You may need at least one dose of MMR. You may also need a second dose.  Pneumococcal 13-valent conjugate (PCV13) vaccine. You may need this if you have certain conditions and were not previously vaccinated.  Pneumococcal polysaccharide (PPSV23) vaccine. You may need one or two doses if you smoke cigarettes or if you have certain conditions.  Meningococcal vaccine. One dose is recommended if you are age 19-21 years and a first-year college student living in a residence hall, or if you have one of several medical conditions. You may also need additional booster doses.  Hepatitis A vaccine. You may need this if you have certain conditions or if you travel or work in places where you may be exposed to hepatitis A.  Hepatitis B vaccine. You may need this if you have certain conditions or if you travel or work in places where you may be exposed to hepatitis B.  Haemophilus influenzae type b (Hib) vaccine. You may need this if   you have certain risk factors. Talk to your health care provider about which screenings and vaccines you need and how often you need them. This information is not intended to replace advice given to you by your health care provider. Make sure you discuss any questions you have with your health care provider. Document Released: 02/22/2001 Document Revised:  08/09/2016 Document Reviewed: 10/28/2014 Elsevier Interactive Patient Education  2019 Elsevier Inc.  Vitamin D Deficiency Vitamin D deficiency is when your body does not have enough vitamin D. Vitamin D is important to your body for many reasons:  It helps the body to absorb two important minerals, called calcium and phosphorus.  It plays a role in bone health.  It may help to prevent some diseases, such as diabetes and multiple sclerosis.  It plays a role in muscle function, including heart function. You can get vitamin D by:  Eating foods that naturally contain vitamin D.  Eating or drinking milk or other dairy products that have vitamin D added to them.  Taking a vitamin D supplement or a multivitamin supplement that contains vitamin D.  Being in the sun. Your body naturally makes vitamin D when your skin is exposed to sunlight. Your body changes the sunlight into a form of the vitamin that the body can use. If vitamin D deficiency is severe, it can cause a condition in which your bones become soft. In adults, this condition is called osteomalacia. In children, this condition is called rickets. What are the causes? Vitamin D deficiency may be caused by:  Not eating enough foods that contain vitamin D.  Not getting enough sun exposure.  Having certain digestive system diseases that make it difficult for your body to absorb vitamin D. These diseases include Crohn disease, chronic pancreatitis, and cystic fibrosis.  Having a surgery in which a part of the stomach or a part of the small intestine is removed.  Being obese.  Having chronic kidney disease or liver disease. What increases the risk? This condition is more likely to develop in:  Older people.  People who do not spend much time outdoors.  People who live in a long-term care facility.  People who have had broken bones.  People with weak or thin bones (osteoporosis).  People who have a disease or condition  that changes how the body absorbs vitamin D.  People who have dark skin.  People who take certain medicines, such as steroid medicines or certain seizure medicines.  People who are overweight or obese. What are the signs or symptoms? In mild cases of vitamin D deficiency, there may not be any symptoms. If the condition is severe, symptoms may include:  Bone pain.  Muscle pain.  Falling often.  Broken bones caused by a minor injury. How is this diagnosed? This condition is usually diagnosed with a blood test. How is this treated? Treatment for this condition may depend on what caused the condition. Treatment options include:  Taking vitamin D supplements.  Taking a calcium supplement. Your health care provider will suggest what dose is best for you. Follow these instructions at home:  Take medicines and supplements only as told by your health care provider.  Eat foods that contain vitamin D. Choices include: ? Fortified dairy products, cereals, or juices. Fortified means that vitamin D has been added to the food. Check the label on the package to be sure. ? Fatty fish, such as salmon or trout. ? Eggs. ? Oysters.  Do not use a tanning bed.    tanning bed. °· Maintain a healthy weight. Lose weight, if needed. °· Keep all follow-up visits as told by your health care provider. This is important. °Contact a health care provider if: °· Your symptoms do not go away. °· You feel like throwing up (nausea) or you throw up (vomit). °· You have fewer bowel movements than usual or it is difficult for you to have a bowel movement (constipation). °This information is not intended to replace advice given to you by your health care provider. Make sure you discuss any questions you have with your health care provider. °Document Released: 03/21/2011 Document Revised: 06/10/2015 Document Reviewed: 05/14/2014 °Elsevier Interactive Patient Education © 2019 Elsevier Inc. ° °

## 2018-03-01 LAB — RPR: RPR Ser Ql: NONREACTIVE

## 2018-03-01 LAB — HIV ANTIBODY (ROUTINE TESTING W REFLEX): HIV Screen 4th Generation wRfx: NONREACTIVE

## 2018-03-01 LAB — HSV 1 AND 2 IGM ABS, INDIRECT
HSV 1 IgM: <1:10 {titer}
HSV 2 IgM: <1:10 {titer}

## 2018-03-01 LAB — HEPATITIS PANEL, ACUTE
HEP B C IGM: NEGATIVE
Hep A IgM: NEGATIVE
Hep C Virus Ab: <0.1 s/co ratio (ref 0.0–0.9)
Hepatitis B Surface Ag: NEGATIVE

## 2018-03-01 LAB — VITAMIN D 25 HYDROXY (VIT D DEFICIENCY, FRACTURES): Vit D, 25-Hydroxy: 21.6 ng/mL — ABNORMAL LOW (ref 30.0–100.0)

## 2018-03-02 LAB — CYTOLOGY - PAP
Chlamydia: NEGATIVE
Diagnosis: NEGATIVE
HPV 16/18/45 genotyping: NEGATIVE
HPV: DETECTED — AB
NEISSERIA GONORRHEA: NEGATIVE
Trichomonas: NEGATIVE

## 2018-03-06 ENCOUNTER — Other Ambulatory Visit: Payer: Self-pay | Admitting: Obstetrics and Gynecology

## 2018-03-06 DIAGNOSIS — E559 Vitamin D deficiency, unspecified: Secondary | ICD-10-CM | POA: Insufficient documentation

## 2018-03-06 MED ORDER — VITAMIN D (ERGOCALCIFEROL) 1.25 MG (50000 UNIT) PO CAPS: 50000.0000 [IU] | ORAL_CAPSULE | ORAL | 1 refills | Status: DC

## 2018-03-07 ENCOUNTER — Other Ambulatory Visit: Payer: Self-pay | Admitting: Obstetrics and Gynecology

## 2018-03-07 MED ORDER — TERCONAZOLE 0.4 % VA CREA: 1.0000 | TOPICAL_CREAM | Freq: Every day | VAGINAL | 0 refills | Status: DC

## 2018-05-03 ENCOUNTER — Other Ambulatory Visit: Payer: Self-pay | Admitting: Family Medicine

## 2018-05-04 NOTE — Telephone Encounter (Signed)
Last office visit 01/05/2018 with R. Baity for nasopharyngitis.  Last refilled 05/01/2017 for #30 with 5 refills.  No future appointments.

## 2018-05-25 IMAGING — CR DG ABDOMEN ACUTE W/ 1V CHEST
4 series · 4 of 4 positions shown · non-contrast
Comparison: CT abdomen and pelvis 03/23/2012.  Chest 11/16/2009.

CLINICAL DATA: The irritable bowel syndrome. Abnormally has
diarrhea every day but no bowel movement now 1 2 days. Enema without
results. Blood after enema. Rectal pain.

EXAM:
DG ABDOMEN ACUTE W/ 1V CHEST

[chest pa]
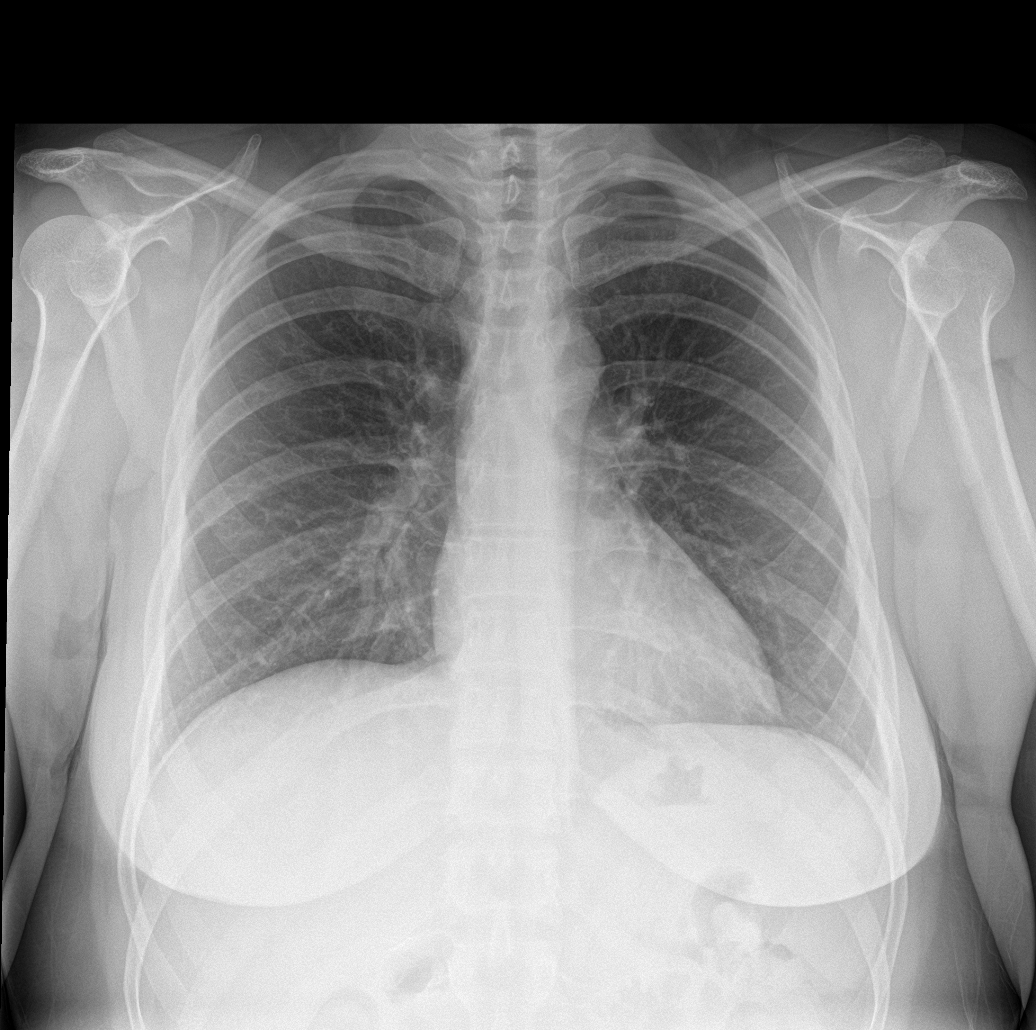

[abdomen erect (1 of 2)]
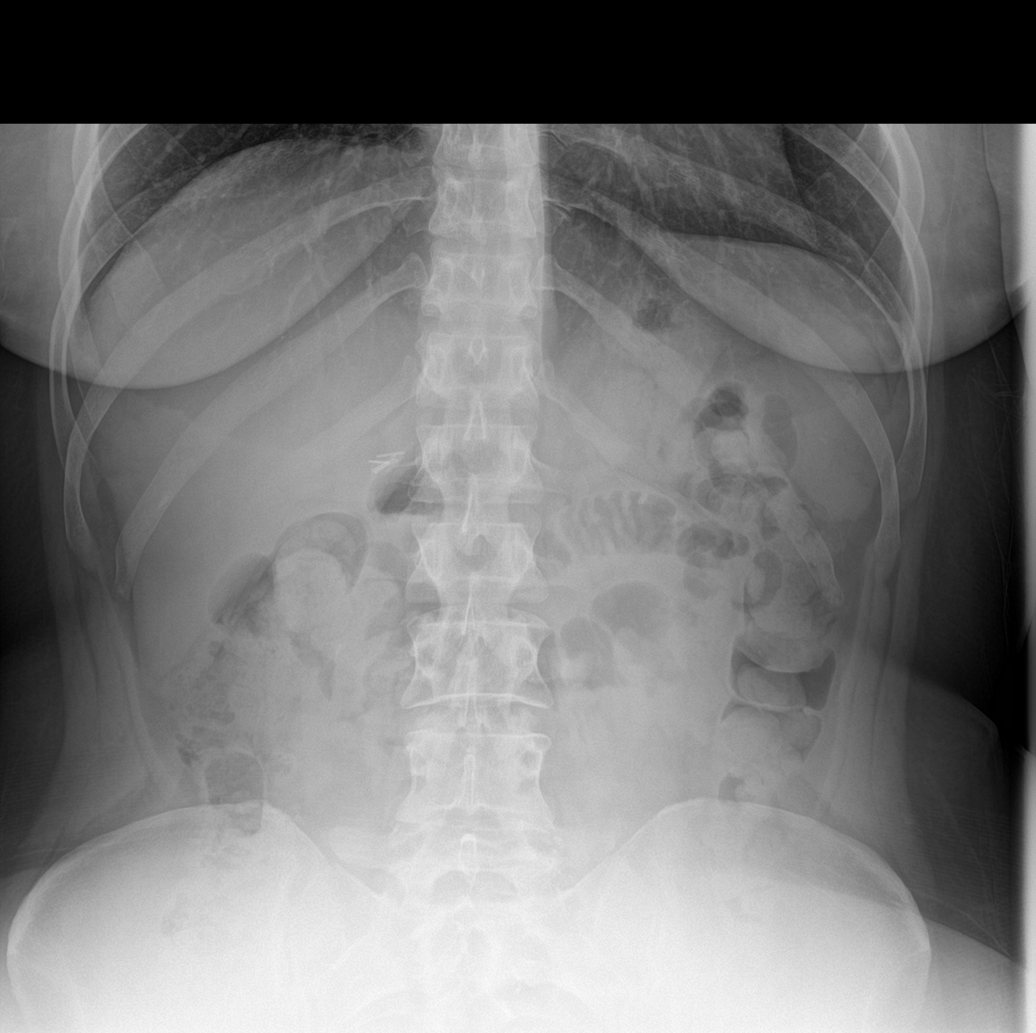

[abdomen erect (2 of 2)]
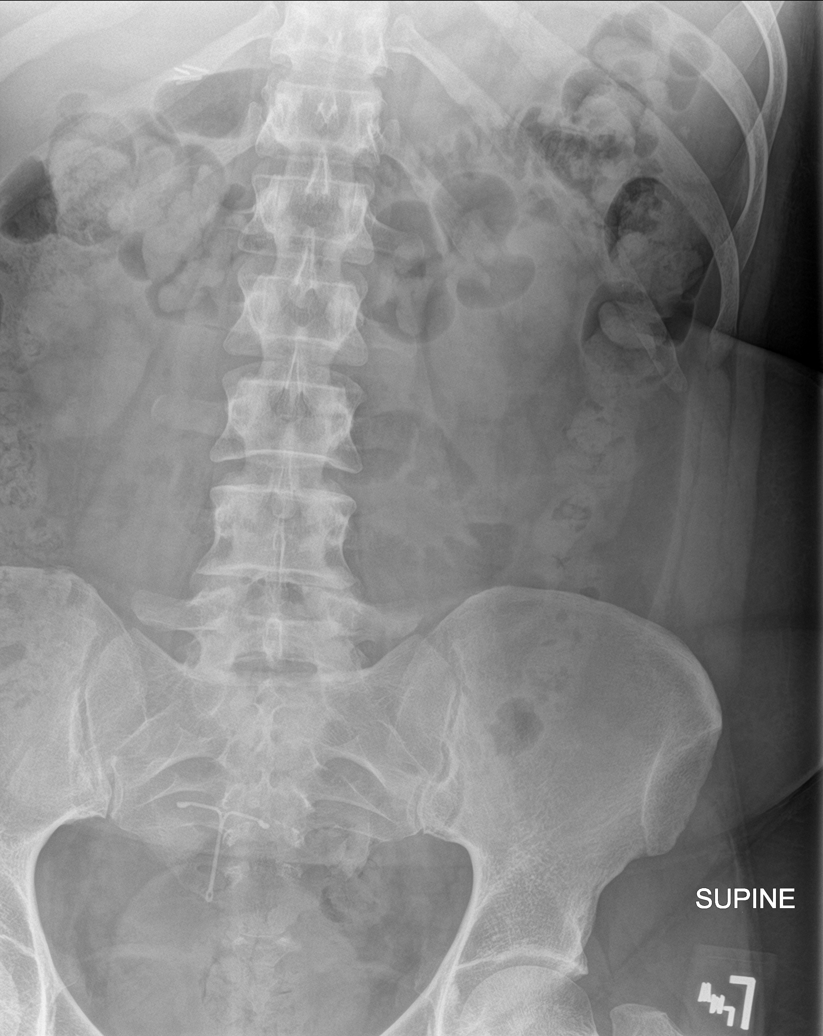

[abdomen supine]
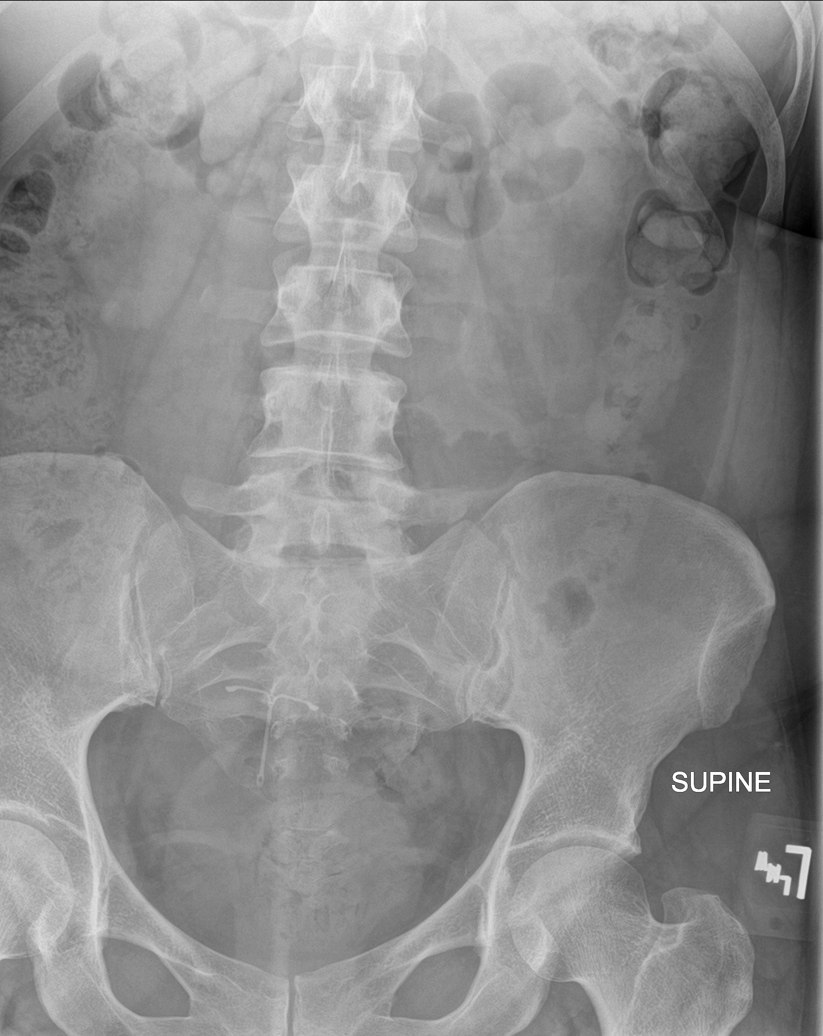

[4 of 4 positions shown; findings below may reference images not displayed]

FINDINGS: Normal heart size and pulmonary vascularity. No focal airspace
disease or consolidation in the lungs. No blunting of costophrenic
angles. No pneumothorax. Mediastinal contours appear intact.

Scattered gas and stool in the colon. No small or large bowel
distention. No free intra-abdominal air. No abnormal air-fluid
levels. No radiopaque stones. Visualized bones appear intact.
Surgical clips in the right upper quadrant. Intrauterine device.
IMPRESSION: No evidence of active pulmonary disease. Nonobstructive bowel gas
pattern.

## 2018-06-11 ENCOUNTER — Encounter: Payer: Self-pay | Admitting: Family Medicine

## 2018-06-11 ENCOUNTER — Ambulatory Visit: Payer: 59 | Admitting: Family Medicine

## 2018-06-11 ENCOUNTER — Other Ambulatory Visit: Payer: Self-pay

## 2018-06-11 ENCOUNTER — Encounter: Payer: Self-pay | Admitting: *Deleted

## 2018-06-11 VITALS — BP 114/82 | HR 78 | Temp 97.9°F | Ht 65.5 in | Wt 222.5 lb

## 2018-06-11 DIAGNOSIS — Z803 Family history of malignant neoplasm of breast: Secondary | ICD-10-CM | POA: Diagnosis not present

## 2018-06-11 DIAGNOSIS — N63 Unspecified lump in unspecified breast: Secondary | ICD-10-CM | POA: Diagnosis not present

## 2018-06-11 DIAGNOSIS — N644 Mastodynia: Secondary | ICD-10-CM

## 2018-06-11 MED ORDER — AMOXICILLIN-POT CLAVULANATE 875-125 MG PO TABS: 1.0000 | ORAL_TABLET | Freq: Two times a day (BID) | ORAL | 0 refills | Status: AC

## 2018-06-11 NOTE — Patient Instructions (Signed)
REFERRALS TO SPECIALISTS, SPECIAL TESTS (MRI, CT, ULTRASOUNDS)  MARION or  Anastasiya will help you. ASK CHECK-IN FOR HELP.  Specialist appointment times vary a great deal, based on their schedule / openings. -- Some specialists have very long wait times. (Example. Dermatology)    

## 2018-06-11 NOTE — Progress Notes (Signed)
Jayvan Mcshan T. Marielis Samara, MD Primary Care and Sports Medicine [facility] [address] [place], [address] Phone: [phone]  FAX: [phone]  Cheyenne Rivera - 38 y.o. female  MRN [id number]  Date of Birth: 09/19/1980  Visit Date: 06/11/2018  PCP: Hannah Beat, MD  Referred by: Hannah Beat, MD  Chief Complaint  Patient presents with  . Breast Pain    Right   Subjective:   Cheyenne Rivera is a 38 y.o. very pleasant female patient who presents with the following:  She presents with R breast concerns and discomfort on the R.  Last mammo was in 2012 at 2012.  She is a well-known patient.  She has had an episode of cellulitis on the breast previously, and this was treated with Keflex.  This is primarily in the region of her nipple, and it is tender medial to this.  She is not had any trauma or injury to the area that she can recall.  She is not had any nipple discharge that she can recall, and she is not having any topical skin changes.  Grandmother does have a history of breast cancer  R side at 3 o'clock  Past Medical History, Surgical History, Social History, Family History, Problem List, Medications, and Allergies have been reviewed and updated if relevant.  Patient Active Problem List   Diagnosis Date Noted  . Vitamin D deficiency 03/06/2018  . Right Achilles tendinitis 02/28/2017  . IBS (irritable bowel syndrome) 01/15/2016  . Severe obesity (BMI >= 40) (HCC) 01/15/2016  . Allergic rhinitis 11/24/2015  . Migraine headache 08/22/2011  . Family history of breast cancer 06/09/2010  . SACROILIITIS, RIGHT 06/04/2009  . BACK PAIN 06/04/2009  . ANXIETY 05/05/2008  . DEPRESSION 05/05/2008    Past Medical History:  Diagnosis Date  . Anxiety   . Depression   . IBS (irritable bowel syndrome) 01/15/2016  . Migraine     Past Surgical History:  Procedure Laterality Date  . CHOLECYSTECTOMY    . INTRAUTERINE DEVICE  INSERTION  03/2007   Mirena  . LEEP  3/03  . TONSILLECTOMY      Social History   Socioeconomic History  . Marital status: Single    Spouse name: Not on file  . Number of children: 2  . Years of education: Not on file  . Highest education level: Not on file  Occupational History  . Occupation: Sales executive    Comment: [employer]  Social Needs  . Financial resource strain: Not on file  . Food insecurity:    Worry: Not on file    Inability: Not on file  . Transportation needs:    Medical: Not on file    Non-medical: Not on file  Tobacco Use  . Smoking status: Never Smoker  . Smokeless tobacco: Never Used  Substance and Sexual Activity  . Alcohol use: Yes    Alcohol/week: 0.0 standard drinks    Comment: rare  . Drug use: No  . Sexual activity: Yes    Birth control/protection: I.U.D.    Comment: mirena  Lifestyle  . Physical activity:    Days per week: Not on file    Minutes per session: Not on file  . Stress: Not on file  Relationships  . Social connections:    Talks on phone: Not on file    Gets together: Not on file    Attends religious service: Not on file    Active member  of club or organization: Not on file    Attends meetings of clubs or organizations: Not on file    Relationship status: Not on file  . Intimate partner violence:    Fear of current or ex partner: Not on file    Emotionally abused: Not on file    Physically abused: Not on file    Forced sexual activity: Not on file  Other Topics Concern  . Not on file  Social History Narrative  . Not on file    Family History  Problem Relation Age of Onset  . Alcohol abuse Father   . Breast cancer Other        Grandmother  . Sudden death Other        >50, Grand father, ?cause  . Colon cancer Neg Hx   . Ovarian cancer Neg Hx   . Lung cancer Neg Hx   . Prostate cancer Neg Hx   . Coronary artery disease Neg Hx   . Stroke Neg Hx   . Diabetes Neg Hx   . Mental illness Neg Hx     Allergies   Allergen Reactions  . Codeine Other (See Comments)  . Morphine     REACTION: rash and nausea    Medication list reviewed and updated in full in [facility].   GEN: No acute illnesses, no fevers, chills. GI: No n/v/d, eating normally Pulm: No SOB Interactive and getting along well at home.  Otherwise, ROS is as per the HPI.  Objective:   BP 114/82   Pulse 78   Temp 97.9 F (36.6 C) (Oral)   Ht 5' 5.5" (1.664 m)   Wt 222 lb 8 oz (100.9 kg)   BMI 36.46 kg/m   GEN: WDWN, NAD, Non-toxic, A & O x 3 HEENT: Atraumatic, Normocephalic. Neck supple. No masses, No LAD. Ears and Nose: No external deformity. BREAST: Chaperoned examination. Entirety of breast examined including axilla, and no enlarged lymph nodes. No nipple discharge. No skin changes.  In the right breast, there is a palpable structural change medially at approximately the 3 o'clock position.  This area is moderately tender to palpation.  There is no redness or warmth. This portion of the physical examination was chaperoned by Terese Door, CMA.  EXTR: No c/c/e NEURO Normal gait.  PSYCH: Normally interactive. Conversant. Not depressed or anxious appearing.  Calm demeanor.   Laboratory and Imaging Data:  Assessment and Plan:   Breast mass in female - Plan: US BREAST LTD UNI RIGHT INC AXILLA, MM DIAG BREAST TOMO BILATERAL  Breast pain in female  Family history of breast cancer  3:00 positional right-sided breast tissue change/mass, assist with bilateral diagnostic mammogram and right sided ultrasound to rule out potential neoplasm.  For now, treat as possible intraductal infection with oral Augmentin.  Follow-up: depending on [place] and Mammo findings  Meds ordered this encounter  Medications  . amoxicillin-clavulanate (AUGMENTIN) 875-125 MG tablet    Sig: Take 1 tablet by mouth 2 (two) times daily for 7 days.    Dispense:  14 tablet    Refill:  0   Orders Placed This Encounter  Procedures  . US BREAST  LTD UNI RIGHT INC AXILLA  . MM DIAG BREAST TOMO BILATERAL    Signed,  Kelis Plasse T. Tijana Walder, MD   Outpatient Encounter Medications as of 06/11/2018  Medication Sig  . levonorgestrel (MIRENA) 20 MCG/24HR IUD 1 Intra Uterine Device (1 each total) by Intrauterine route once.  . promethazine (PHENERGAN)  25 MG tablet TAKE 1 TABLET BY MOUTH EVERY 6 HOURS AS NEEDED  . rizatriptan (MAXALT) 10 MG tablet TAKE 1 TABLET BY MOUTH AS NEEDED FOR MIGRAINE. MAY REPEAT IN 2 HOURS IF NEEDED  . Vitamin D, Ergocalciferol, (DRISDOL) 1.25 MG (50000 UT) CAPS capsule Take 1 capsule (50,000 Units total) by mouth every 7 (seven) days.  Marland Kitchen. amoxicillin-clavulanate (AUGMENTIN) 875-125 MG tablet Take 1 tablet by mouth 2 (two) times daily for 7 days.  . [DISCONTINUED] azithromycin (ZITHROMAX) 250 MG tablet Take 2 tabs today, then 1 tab daily x 4 days (Patient not taking: Reported on 02/27/2018)  . [DISCONTINUED] cefUROXime (CEFTIN) 500 MG tablet Take 1 tablet (500 mg total) by mouth 2 (two) times daily with a meal. (Patient not taking: Reported on 02/27/2018)  . [DISCONTINUED] promethazine-dextromethorphan (PROMETHAZINE-DM) 6.25-15 MG/5ML syrup Take 5 mLs by mouth 4 (four) times daily as needed. (Patient not taking: Reported on 02/27/2018)  . [DISCONTINUED] terconazole (TERAZOL 7) 0.4 % vaginal cream Place 1 applicator vaginally at bedtime.   No facility-administered encounter medications on file as of 06/11/2018.

## 2018-06-20 ENCOUNTER — Ambulatory Visit
Admission: RE | Admit: 2018-06-20 | Discharge: 2018-06-20 | Disposition: A | Discharge status: Home / Self Care | Payer: 59 | Source: Ambulatory Visit | Attending: Family Medicine | Admitting: Family Medicine

## 2018-06-20 ENCOUNTER — Other Ambulatory Visit: Payer: Self-pay

## 2018-06-20 ENCOUNTER — Other Ambulatory Visit: Payer: Self-pay | Admitting: Family Medicine

## 2018-06-20 DIAGNOSIS — R928 Other abnormal and inconclusive findings on diagnostic imaging of breast: Secondary | ICD-10-CM

## 2018-06-20 DIAGNOSIS — N63 Unspecified lump in unspecified breast: Secondary | ICD-10-CM | POA: Diagnosis not present

## 2018-06-20 DIAGNOSIS — N6001 Solitary cyst of right breast: Secondary | ICD-10-CM

## 2018-06-20 DIAGNOSIS — N6320 Unspecified lump in the left breast, unspecified quadrant: Secondary | ICD-10-CM | POA: Insufficient documentation

## 2018-06-27 ENCOUNTER — Other Ambulatory Visit: Payer: Self-pay

## 2018-06-27 ENCOUNTER — Ambulatory Visit
Admission: RE | Admit: 2018-06-27 | Discharge: 2018-06-27 | Disposition: A | Discharge status: Home / Self Care | Payer: 59 | Source: Ambulatory Visit | Attending: Family Medicine | Admitting: Family Medicine

## 2018-06-27 DIAGNOSIS — N6001 Solitary cyst of right breast: Secondary | ICD-10-CM

## 2018-06-27 DIAGNOSIS — R928 Other abnormal and inconclusive findings on diagnostic imaging of breast: Secondary | ICD-10-CM

## 2018-10-02 ENCOUNTER — Encounter: Payer: Self-pay | Admitting: Obstetrics and Gynecology

## 2018-10-02 ENCOUNTER — Other Ambulatory Visit: Payer: Self-pay

## 2018-10-02 ENCOUNTER — Other Ambulatory Visit (INDEPENDENT_AMBULATORY_CARE_PROVIDER_SITE_OTHER): Payer: 59

## 2018-10-02 ENCOUNTER — Ambulatory Visit: Payer: 59 | Admitting: Obstetrics and Gynecology

## 2018-10-02 VITALS — BP 123/82 | HR 80 | Ht 65.5 in | Wt 224.1 lb

## 2018-10-02 DIAGNOSIS — Z30431 Encounter for routine checking of intrauterine contraceptive device: Secondary | ICD-10-CM | POA: Diagnosis not present

## 2018-10-02 DIAGNOSIS — N930 Postcoital and contact bleeding: Secondary | ICD-10-CM

## 2018-10-02 NOTE — Addendum Note (Signed)
Addended by: Keturah Barre L on: 10/02/2018 02:20 PM   Modules accepted: Orders

## 2018-10-02 NOTE — Progress Notes (Signed)
  Subjective:     Patient ID: Cheyenne Rivera, female   DOB: [birth year], 38 y.o.   MRN: [id number]  HPI Reports severe lower pelvic cramping 2 weeks ago with postcoital bleeding on the 14th. No pain with sex. Never had this happen before.  Denies h/o ovarian cyst. Does report slight increase in urinary urgency. Currently sexually active with one female partner.            Review of Systems  All other systems reviewed and are negative.      Objective:   Physical Exam A&Ox4 Well groomed female in no distress Blood pressure 123/82, pulse 80, height 5' 5.5" (1.664 m), weight 224 lb 2 oz (101.7 kg), last menstrual period 09/24/2018. Pelvic exam: normal external genitalia, vulva, vagina, cervix, uterus and adnexa, WET MOUNT done - results: negative for pathogens, normal epithelial cells, IUD string noted seen on speculum exam. Urinalysis    Component Value Date/Time   COLORURINE YELLOW (A) 01/20/2016 0530   APPEARANCEUR CLEAR (A) 01/20/2016 0530   APPEARANCEUR Hazy 03/23/2012 1810   LABSPEC 1.016 01/20/2016 0530   LABSPEC 1.024 03/23/2012 1810   PHURINE 5.0 01/20/2016 0530   GLUCOSEU NEGATIVE 01/20/2016 0530   GLUCOSEU Negative 03/23/2012 1810   HGBUR NEGATIVE 01/20/2016 0530   BILIRUBINUR NEGATIVE 01/20/2016 0530   BILIRUBINUR Negative 03/23/2012 1810   KETONESUR 5 (A) 01/20/2016 0530   PROTEINUR NEGATIVE 01/20/2016 0530   UROBILINOGEN 0.2 01/09/2007 2318   NITRITE NEGATIVE 01/20/2016 0530   LEUKOCYTESUR NEGATIVE 01/20/2016 0530   LEUKOCYTESUR Negative 03/23/2012 1810    Pelvic u/s done today reveals: .   IUD in place without abnormal findings. Assessment:     Postcoital bleeding IUD check    Plan:     reasured and desires continuing with IUD use. RTC as needed.  Hoang Reich,CNM

## 2018-10-03 ENCOUNTER — Encounter: Payer: Self-pay | Admitting: *Deleted

## 2018-10-06 LAB — NUSWAB VAGINITIS PLUS (VG+)
Candida albicans, NAA: NEGATIVE
Candida glabrata, NAA: NEGATIVE
Chlamydia trachomatis, NAA: NEGATIVE
Neisseria gonorrhoeae, NAA: NEGATIVE
Trich vag by NAA: NEGATIVE

## 2019-03-05 ENCOUNTER — Encounter: Payer: 59 | Admitting: Obstetrics and Gynecology

## 2019-06-20 DIAGNOSIS — M5412 Radiculopathy, cervical region: Secondary | ICD-10-CM | POA: Diagnosis not present

## 2019-06-20 DIAGNOSIS — S161XXA Strain of muscle, fascia and tendon at neck level, initial encounter: Secondary | ICD-10-CM | POA: Diagnosis not present

## 2019-09-05 DIAGNOSIS — R05 Cough: Secondary | ICD-10-CM | POA: Diagnosis not present

## 2019-09-05 DIAGNOSIS — Z20822 Contact with and (suspected) exposure to covid-19: Secondary | ICD-10-CM | POA: Diagnosis not present

## 2019-09-05 DIAGNOSIS — Z03818 Encounter for observation for suspected exposure to other biological agents ruled out: Secondary | ICD-10-CM | POA: Diagnosis not present

## 2019-09-06 DIAGNOSIS — R509 Fever, unspecified: Secondary | ICD-10-CM | POA: Diagnosis not present

## 2019-09-06 DIAGNOSIS — R05 Cough: Secondary | ICD-10-CM | POA: Diagnosis not present

## 2019-09-06 DIAGNOSIS — J22 Unspecified acute lower respiratory infection: Secondary | ICD-10-CM | POA: Diagnosis not present

## 2019-11-07 ENCOUNTER — Encounter: Payer: Self-pay | Admitting: Family Medicine

## 2019-11-07 DIAGNOSIS — U071 COVID-19: Secondary | ICD-10-CM | POA: Diagnosis not present

## 2019-11-07 DIAGNOSIS — Z03818 Encounter for observation for suspected exposure to other biological agents ruled out: Secondary | ICD-10-CM | POA: Diagnosis not present

## 2019-11-07 DIAGNOSIS — Z20822 Contact with and (suspected) exposure to covid-19: Secondary | ICD-10-CM | POA: Diagnosis not present

## 2019-11-10 NOTE — Telephone Encounter (Signed)
I spoke with Cheyenne Rivera today, and she is doing better.  She is still having bad body aches, but her breathing is better and without significant shortness of breath. She is feeling better and has been using some albuterol.

## 2019-11-20 ENCOUNTER — Encounter: Payer: Self-pay | Admitting: Family Medicine

## 2019-11-20 MED ORDER — PREDNISONE 20 MG PO TABS: ORAL_TABLET | ORAL | 0 refills | Status: DC

## 2019-11-20 NOTE — Telephone Encounter (Signed)
Can we get her an appointment on Monday?

## 2019-11-20 NOTE — Telephone Encounter (Signed)
Appointment 11/15 pt aware

## 2019-11-24 ENCOUNTER — Encounter: Payer: Self-pay | Admitting: Family Medicine

## 2019-11-24 NOTE — Progress Notes (Signed)
Cheyenne Rivera T. Breylon Sherrow, MD, CAQ Sports Medicine  Primary Care and Sports Medicine [facility] [address] [place], [address]  Phone: [phone]  FAX: [phone]  TANGANIKA BARRADAS - 39 y.o. female  MRN [id number]  Date of Birth: 1980/07/15  Date: 11/25/2019  PCP: Hannah Beat, MD  Referral: Hannah Beat, MD  Chief Complaint  Patient presents with  . Follow-up    Covid-Discuss FMLA    This visit occurred during the SARS-CoV-2 public health emergency.  Safety protocols were in place, including screening questions prior to the visit, additional usage of staff PPE, and extensive cleaning of exam room while observing appropriate contact time as indicated for disinfecting solutions.   Subjective:   [name] is a 39 y.o. very pleasant female patient with Body mass index is 36.71 kg/m. who presents with the following:  She presents today in follow-up regarding her COVID-19 case, and she was doing well initially, but the second time I spoke to her she was having some shortness of breath.  Initially, I did give her some albuterol, and when she contacted me at the end of last week also gave her some prednisone to take.  She is here to follow-up regarding this.  No achiness Trouble breathing well. Symptoms felt mostly OK, but then was not doing all that well. Started 11/05/2019 with symptoms.  Home test was positive and PCR.  Progressed and cough did get worse.  Felt like muscles were hurting a lot. Smell is decreased.   Taste has come back.  Went to work and had difficulty walking up and down the stairs.  Was only minimally active and had a lot of probs with energy.  Woke up and still felt pretty.  Feels a little better but not 100%.  She is still very fatigued and has been sleeping much of the time.  First was not able to go back to work: 11/07/2019 - tested positive and out of work on that date.  Review of Systems is noted  in the HPI, as appropriate  Objective:   BP 110/78   Pulse 86   Temp 98 F (36.7 C) (Temporal)   Ht 5' 5.5" (1.664 m)   Wt 224 lb (101.6 kg)   SpO2 97%   BMI 36.71 kg/m   GEN: No acute distress; alert,appropriate. PULM: Breathing comfortably in no respiratory distress PSYCH: Normally interactive.  CV: RRR, no m/g/r  PULM: Normal respiratory rate, no accessory muscle use. No wheezes, crackles or rhonchi   Laboratory and Imaging Data:  Assessment and Plan:     ICD-10-CM   1. COVID-19  U07.1 DG Chest 2 View  2. SOB (shortness of breath)  R06.02 DG Chest 2 View   Total encounter time: 30 minutes. This includes total time spent on the day of encounter.  Additional chart reviewed, and overall discussion regarding Covid pneumonia in addition to review of FMLA and short-term disability.  She has worsened in about 10 to 14 days after the onset of her Covid, and she has had some difficulty and shortness of breath with short walks.  This was noticeably worse when she went to work and was wearing her mask.  Date of onset out of work November 07, 2019 when the patient tested positive for Covid.  She is here today for additional evaluation.  She has been on some steroids as well as some albuterol.  She is still quite fatigued.  Formal FMLA and disability  paperwork is pending.  Anticipate return to work 2 weeks from today's date.  Obtain chest x-ray to further evaluate for potential secondary pneumonia.  Orders Placed This Encounter  Procedures  . DG Chest 2 View    Follow-up: No follow-ups on file.  Signed,  Elpidio Galea. Alverda Nazzaro, MD   Outpatient Encounter Medications as of 11/25/2019  Medication Sig  . albuterol (VENTOLIN HFA) 108 (90 Base) MCG/ACT inhaler Inhale into the lungs.  Marland Kitchen levonorgestrel (MIRENA) 20 MCG/24HR IUD 1 Intra Uterine Device (1 each total) by Intrauterine route once.  . predniSONE (DELTASONE) 20 MG tablet 2 tabs po daily for 5 days, then 1 tab po daily for 5  days  . promethazine (PHENERGAN) 25 MG tablet TAKE 1 TABLET BY MOUTH EVERY 6 HOURS AS NEEDED (Patient not taking: Reported on 11/25/2019)  . rizatriptan (MAXALT) 10 MG tablet TAKE 1 TABLET BY MOUTH AS NEEDED FOR MIGRAINE. MAY REPEAT IN 2 HOURS IF NEEDED (Patient not taking: Reported on 11/25/2019)  . [DISCONTINUED] Vitamin D, Ergocalciferol, (DRISDOL) 1.25 MG (50000 UT) CAPS capsule Take 1 capsule (50,000 Units total) by mouth every 7 (seven) days. (Patient not taking: Reported on 10/02/2018)   No facility-administered encounter medications on file as of 11/25/2019.

## 2019-11-25 ENCOUNTER — Encounter: Payer: Self-pay | Admitting: Family Medicine

## 2019-11-25 ENCOUNTER — Ambulatory Visit (INDEPENDENT_AMBULATORY_CARE_PROVIDER_SITE_OTHER)
Admission: RE | Admit: 2019-11-25 | Discharge: 2019-11-25 | Disposition: A | Discharge status: Home / Self Care | Payer: BC Managed Care – PPO | Source: Ambulatory Visit | Attending: Family Medicine | Admitting: Family Medicine

## 2019-11-25 ENCOUNTER — Other Ambulatory Visit: Payer: Self-pay

## 2019-11-25 ENCOUNTER — Ambulatory Visit: Payer: BC Managed Care – PPO | Admitting: Family Medicine

## 2019-11-25 VITALS — BP 110/78 | HR 86 | Temp 98.0°F | Ht 65.5 in | Wt 224.0 lb

## 2019-11-25 DIAGNOSIS — U071 COVID-19: Secondary | ICD-10-CM | POA: Diagnosis not present

## 2019-11-25 DIAGNOSIS — R0602 Shortness of breath: Secondary | ICD-10-CM | POA: Diagnosis not present

## 2019-11-25 DIAGNOSIS — J90 Pleural effusion, not elsewhere classified: Secondary | ICD-10-CM | POA: Diagnosis not present

## 2019-11-27 ENCOUNTER — Telehealth: Payer: Self-pay | Admitting: Family Medicine

## 2019-11-27 NOTE — Telephone Encounter (Signed)
Paperwork for FMLA placed in Dr.Copland's inbox in folder. 2 forms for review and signature.

## 2019-11-28 ENCOUNTER — Encounter: Payer: Self-pay | Admitting: Family Medicine

## 2019-12-09 ENCOUNTER — Encounter: Payer: Self-pay | Admitting: Family Medicine

## 2019-12-17 ENCOUNTER — Encounter: Payer: Self-pay | Admitting: Family Medicine

## 2019-12-18 ENCOUNTER — Other Ambulatory Visit: Payer: Self-pay | Admitting: Family Medicine

## 2019-12-18 NOTE — Progress Notes (Signed)
letter

## 2020-01-15 ENCOUNTER — Encounter: Payer: Self-pay | Admitting: Family Medicine

## 2020-01-15 MED ORDER — AMOXICILLIN-POT CLAVULANATE 875-125 MG PO TABS: 1.0000 | ORAL_TABLET | Freq: Two times a day (BID) | ORAL | 0 refills | Status: AC

## 2020-02-11 ENCOUNTER — Encounter: Payer: Self-pay | Admitting: Family Medicine

## 2020-02-11 ENCOUNTER — Telehealth (INDEPENDENT_AMBULATORY_CARE_PROVIDER_SITE_OTHER): Payer: BC Managed Care – PPO | Admitting: Family Medicine

## 2020-02-11 DIAGNOSIS — Z03818 Encounter for observation for suspected exposure to other biological agents ruled out: Secondary | ICD-10-CM | POA: Diagnosis not present

## 2020-02-11 DIAGNOSIS — R059 Cough, unspecified: Secondary | ICD-10-CM

## 2020-02-11 DIAGNOSIS — Z20822 Contact with and (suspected) exposure to covid-19: Secondary | ICD-10-CM | POA: Diagnosis not present

## 2020-02-11 DIAGNOSIS — R0981 Nasal congestion: Secondary | ICD-10-CM

## 2020-02-11 MED ORDER — BENZONATATE 100 MG PO CAPS: 100.0000 mg | ORAL_CAPSULE | Freq: Three times a day (TID) | ORAL | 0 refills | Status: DC | PRN

## 2020-02-11 NOTE — Progress Notes (Signed)
Virtual Visit via Video Note  I connected with Cheyenne Rivera  on 02/11/20 at 11:20 AM EST by a video enabled telemedicine application and verified that I am speaking with the correct person using two identifiers.  Location patient: home, [place] Location provider:work or home office Persons participating in the virtual visit: patient, provider  I discussed the limitations of evaluation and management by telemedicine and the availability of in person appointments. The patient expressed understanding and agreed to proceed.   HPI:  Acute telemedicine visit for flu like symptoms: -Onset: 02/06/20; had negative covid test that day -Symptoms include: fever, body aches, congested, cough, diarrhea -had close exposure to covid - daughter and a friend had covid in the last 2 weeks -Denies: CP, SOB, vomiting, loss of taste -  hasn't gotten it back from prior covid infection, inability to get out of bed/eat/drink -Has tried: vitamins - zinc, vit C and D; nasal saline -Pertinent past medical history: overweight -Pertinent medication allergies: codeine, morphine -COVID-19 vaccine status: had bad covid in October 2021; also has been vaccinated x 2  ROS: See pertinent positives and negatives per HPI.  Past Medical History:  Diagnosis Date  . Anxiety   . Depression   . Family history of breast cancer 06/09/2010  . IBS (irritable bowel syndrome) 01/15/2016  . Migraine     Past Surgical History:  Procedure Laterality Date  . CHOLECYSTECTOMY    . INTRAUTERINE DEVICE INSERTION  03/2007   Mirena  . LEEP  3/03  . TONSILLECTOMY       Current Outpatient Medications:  .  benzonatate (TESSALON PERLES) 100 MG capsule, Take 1 capsule (100 mg total) by mouth 3 (three) times daily as needed., Disp: 20 capsule, Rfl: 0 .  albuterol (VENTOLIN HFA) 108 (90 Base) MCG/ACT inhaler, Inhale into the lungs., Disp: , Rfl:  .  levonorgestrel (MIRENA) 20 MCG/24HR IUD, 1 Intra Uterine Device (1 each total) by Intrauterine route  once., Disp: 1 each, Rfl: 0 .  predniSONE (DELTASONE) 20 MG tablet, 2 tabs po daily for 5 days, then 1 tab po daily for 5 days, Disp: 15 tablet, Rfl: 0 .  promethazine (PHENERGAN) 25 MG tablet, TAKE 1 TABLET BY MOUTH EVERY 6 HOURS AS NEEDED (Patient not taking: Reported on 11/25/2019), Disp: 30 tablet, Rfl: 5 .  rizatriptan (MAXALT) 10 MG tablet, TAKE 1 TABLET BY MOUTH AS NEEDED FOR MIGRAINE. MAY REPEAT IN 2 HOURS IF NEEDED (Patient not taking: Reported on 11/25/2019), Disp: 10 tablet, Rfl: 0  EXAM:  VITALS per patient if applicable:  GENERAL: alert, oriented, appears well and in no acute distress  HEENT: atraumatic, conjunttiva clear, no obvious abnormalities on inspection of external nose and ears  NECK: normal movements of the head and neck  LUNGS: on inspection no signs of respiratory distress, breathing rate appears normal, no obvious gross SOB, gasping or wheezing  CV: no obvious cyanosis  MS: moves all visible extremities without noticeable abnormality  PSYCH/NEURO: pleasant and cooperative, no obvious depression or anxiety, speech and thought processing grossly intact  ASSESSMENT AND PLAN:  Discussed the following assessment and plan:  Cough  Nasal congestion  -we discussed possible serious and likely etiologies, options for evaluation and workup, limitations of telemedicine visit vs in person visit, treatment, treatment risks and precautions. Pt prefers to treat via telemedicine empirically rather than in person at this moment.  Query influenza, viral illness, possible Covid with false negative testing versus other.  Have seen quite a few cases of Covid with early  testing negative, then positive testing later on.  Discussed testing options in case she wants to to retest, sent Tessalon for cough and discussed other symptomatic home care measures, summarized in patient instructions.   Work/School slipped offered: provided in patient instructions  Scheduled follow up with PCP  offered: Agrees to schedule follow-up if needed Advised to seek prompt in person care if worsening, new symptoms arise, or if is not improving with treatment. Discussed options for inperson care if PCP office not available. Did let this patient know that I only do telemedicine on Tuesdays and Thursdays for [facility]. Advised to schedule follow up visit with PCP or [facility] if any further questions or concerns to avoid delays in care.   I discussed the assessment and treatment plan with the patient. The patient was provided an opportunity to ask questions and all were answered. The patient agreed with the plan and demonstrated an understanding of the instructions.     Terressa Koyanagi, DO

## 2020-02-11 NOTE — Patient Instructions (Signed)
   ---------------------------------------------------------------------------------------------------------------------------      WORK SLIP:  Patient Cheyenne Rivera,  [birth year], was seen for a medical visit today, 02/11/20 . Please excuse from work for a COVID like illness. We advise 10 days minimum from the onset of symptoms (02/06/20) PLUS 1 day of no fever and improved symptoms. Will defer to employer for a sooner return to work if symptoms have resolved, it is greater than 5 days since the positive test and the patient can wear a high-quality, tight fitting mask such as N95 or KN95 at all times for an additional 5 days. Would also suggest COVID19 antigen testing is negative prior to return.  Sincerely: E-signature: Dr. Kriste Basque, DO [facility] Ph: [phone]   ------------------------------------------------------------------------------------------------------------------------------    HOME CARE TIPS:  [name] COVID19 testing information: [email or web address] [place] [phone] Most pharmacies also offer testing and home test kits.  -I sent the medication(s) we discussed to your pharmacy: Meds ordered this encounter  Medications  . benzonatate (TESSALON PERLES) 100 MG capsule    Sig: Take 1 capsule (100 mg total) by mouth 3 (three) times daily as needed.    Dispense:  20 capsule    Refill:  0     -can use tylenol or aleve if needed for fevers, aches and pains per instructions  -can use nasal saline a few times per day if you have nasal congestion; sometimes  a short course of Afrin nasal spray for 3 days can help with symptoms as well  -stay hydrated, drink plenty of fluids and eat small healthy meals - avoid dairy  -can take 1000 IU ( ) Vit D3 and 100-500 mg of Vit C daily per instructions  -If the Covid test is positive, check out the CDC website for more information on home care, transmission  and treatment for COVID19  -follow up with your doctor in 2-3 days unless improving and feeling better  -stay home while sick, except to seek medical care, and if you have COVID19 ideally it would be best to stay home for a full 10 days since the onset of symptoms PLUS one day of no fever and feeling better. Wear a good mask (such as N95 or KN95) if around others to reduce the risk of transmission.  It was nice to meet you today, and I really hope you are feeling better soon. I help [facility] out with telemedicine visits on Tuesdays and Thursdays and am available for visits on those days. If you have any concerns or questions following this visit please schedule a follow up visit with your Primary Care doctor or seek care at a local urgent care clinic to avoid delays in care.    Seek in person care or schedule a follow up video visit promptly if your symptoms worsen, new concerns arise or you are not improving with treatment. Call 911 and/or seek emergency care if your symptoms are severe or life threatening.

## 2020-02-12 ENCOUNTER — Ambulatory Visit (INDEPENDENT_AMBULATORY_CARE_PROVIDER_SITE_OTHER): Payer: BC Managed Care – PPO

## 2020-02-12 ENCOUNTER — Ambulatory Visit
Admission: RE | Admit: 2020-02-12 | Discharge: 2020-02-12 | Disposition: A | Discharge status: Home / Self Care | Payer: BC Managed Care – PPO | Source: Ambulatory Visit | Attending: Family Medicine | Admitting: Family Medicine

## 2020-02-12 ENCOUNTER — Other Ambulatory Visit: Payer: Self-pay

## 2020-02-12 VITALS — BP 145/105 | HR 96 | Temp 98.7°F | Resp 20

## 2020-02-12 DIAGNOSIS — R509 Fever, unspecified: Secondary | ICD-10-CM | POA: Diagnosis not present

## 2020-02-12 DIAGNOSIS — R059 Cough, unspecified: Secondary | ICD-10-CM | POA: Diagnosis not present

## 2020-02-12 DIAGNOSIS — B349 Viral infection, unspecified: Secondary | ICD-10-CM

## 2020-02-12 DIAGNOSIS — R0602 Shortness of breath: Secondary | ICD-10-CM | POA: Diagnosis not present

## 2020-02-12 DIAGNOSIS — Z1152 Encounter for screening for COVID-19: Secondary | ICD-10-CM | POA: Diagnosis not present

## 2020-02-12 NOTE — Discharge Instructions (Signed)
Your x ray was normal We are testing you for covid and flu You can take OTC medicines as needed.  Follow up as needed for continued or worsening symptoms

## 2020-02-12 NOTE — ED Triage Notes (Signed)
Pt c/o sore throat, cough, fever with Tmax 102 onset Friday. Pt states sore throat resolved Sunday. C/o "wheezing" last night, improved with inhaler this morning.  Had COVID in October.  Bilateral upper lobes with coarse sounds. No acute distress. Took tessalon pearls today.   Pt states she has had 2 rapid and 2 PCR COVID tests in the past 3 days-al negative.

## 2020-02-13 LAB — COVID-19, FLU A+B NAA
Influenza A, NAA: NOT DETECTED
Influenza B, NAA: NOT DETECTED
SARS-CoV-2, NAA: NOT DETECTED

## 2020-02-13 NOTE — ED Provider Notes (Signed)
Cheyenne Rivera    CSN: [id number] Arrival date & time: 02/12/20  1445      History   Chief Complaint No chief complaint on file.   HPI Cheyenne Rivera is a 40 y.o. female.   Pt is a 40 year old female that presents with  sore throat, cough, fever with Tmax 102 onset Friday. Pt states sore throat resolved Sunday.C/o "wheezing" last night, improved with inhaler this morning. Had COVID in October.Took tessalon pearls today. Had 2 rapid and 2 PCR COVID tests in the past 3 days all negative.      Past Medical History:  Diagnosis Date  . Anxiety   . Depression   . Family history of breast cancer 06/09/2010  . IBS (irritable bowel syndrome) 01/15/2016  . Migraine     Patient Active Problem List   Diagnosis Date Noted  . Vitamin D deficiency 03/06/2018  . IBS (irritable bowel syndrome) 01/15/2016  . Severe obesity (BMI >= 40) (HCC) 01/15/2016  . Migraine headache 08/22/2011  . Family history of breast cancer 06/09/2010  . BACK PAIN 06/04/2009  . GAD (generalized anxiety disorder) 05/05/2008  . Depression, major, in remission (HCC) 05/05/2008    Past Surgical History:  Procedure Laterality Date  . CHOLECYSTECTOMY    . INTRAUTERINE DEVICE INSERTION  03/2007   Mirena  . LEEP  3/03  . TONSILLECTOMY      OB History    Gravida  2   Para  2   Term  2   Preterm      AB      Living  2     SAB      IAB      Ectopic      Multiple      Live Births  2            Home Medications    Prior to Admission medications   Medication Sig Start Date End Date Taking? Authorizing Provider  albuterol (VENTOLIN HFA) 108 (90 Base) MCG/ACT inhaler Inhale into the lungs. 09/06/19 09/05/20 Yes [provider]  benzonatate (TESSALON PERLES) 100 MG capsule Take 1 capsule (100 mg total) by mouth 3 (three) times daily as needed. 02/11/20  Yes Kim, Hannah R, DO  levonorgestrel (MIRENA) 20 MCG/24HR IUD 1 Intra Uterine Device (1 each total) by Intrauterine route once.  10/24/14 02/12/20 Yes Shambley, Melody N, CNM  promethazine (PHENERGAN) 25 MG tablet TAKE 1 TABLET BY MOUTH EVERY 6 HOURS AS NEEDED Patient not taking: No sig reported 05/04/18 02/12/20  Copland, Spencer, MD  rizatriptan (MAXALT) 10 MG tablet TAKE 1 TABLET BY MOUTH AS NEEDED FOR MIGRAINE. MAY REPEAT IN 2 HOURS IF NEEDED Patient not taking: No sig reported 05/04/18 02/12/20  Copland, Spencer, MD    Family History Family History  Problem Relation Age of Onset  . Alcohol abuse Father   . Sudden death Other        >50, Grand father, ?cause  . Breast cancer Maternal Grandmother        50 's ?  . Colon cancer Neg Hx   . Ovarian cancer Neg Hx   . Lung cancer Neg Hx   . Prostate cancer Neg Hx   . Coronary artery disease Neg Hx   . Stroke Neg Hx   . Diabetes Neg Hx   . Mental illness Neg Hx     Social History Social History   Tobacco Use  . Smoking status: Never Smoker  . Smokeless tobacco:  Never Used  Substance Use Topics  . Alcohol use: Yes    Alcohol/week: 0.0 standard drinks    Comment: rare  . Drug use: No     Allergies   Codeine and Morphine   Review of Systems Review of Systems   Physical Exam Triage Vital Signs ED Triage Vitals  Enc Vitals Group     BP 02/12/20 1515 (!) 145/105     Pulse Rate 02/12/20 1515 96     Resp 02/12/20 1515 20     Temp 02/12/20 1515 98.7 F (37.1 C)     Temp Source 02/12/20 1515 Oral     SpO2 02/12/20 1515 96 %     Weight --      Height --      Head Circumference --      Peak Flow --      Pain Score 02/12/20 1516 4     Pain Loc --      Pain Edu? --      Excl. in GC? --    No data found.  Updated Vital Signs BP (!) 145/105 (BP Location: Left Arm)   Pulse 96   Temp 98.7 F (37.1 C) (Oral)   Resp 20   SpO2 96%   Visual Acuity Right Eye Distance:   Left Eye Distance:   Bilateral Distance:    Right Eye Near:   Left Eye Near:    Bilateral Near:     Physical Exam Vitals and nursing note reviewed.  Constitutional:       General: She is not in acute distress.    Appearance: Normal appearance. She is not ill-appearing, toxic-appearing or diaphoretic.  HENT:     Head: Normocephalic.     Right Ear: Tympanic membrane and ear canal normal.     Left Ear: Tympanic membrane and ear canal normal.     Nose: Congestion present.     Mouth/Throat:     Pharynx: Oropharynx is clear.  Eyes:     Conjunctiva/sclera: Conjunctivae normal.  Cardiovascular:     Rate and Rhythm: Normal rate and regular rhythm.  Pulmonary:     Effort: Pulmonary effort is normal.     Breath sounds: Normal breath sounds.  Musculoskeletal:        General: Normal range of motion.     Cervical back: Normal range of motion.  Skin:    General: Skin is warm and dry.     Findings: No rash.  Neurological:     Mental Status: She is alert.  Psychiatric:        Mood and Affect: Mood normal.      UC Treatments / Results  Labs (all labs ordered are listed, but only abnormal results are displayed) Labs Reviewed  COVID-19, FLU A+B NAA    EKG   Radiology DG Chest 2 View  Result Date: 02/12/2020 CLINICAL DATA:  Cough, fever, and shortness of breath. EXAM: CHEST - 2 VIEW COMPARISON:  11/25/2019 FINDINGS: The heart size and mediastinal contours are within normal limits. Both lungs are clear. The visualized skeletal structures are unremarkable. IMPRESSION: No active cardiopulmonary disease. Electronically Signed   By: Signa Kell M.D.   On: 02/12/2020 15:52    Procedures Procedures (including critical care time)  Medications Ordered in UC Medications - No data to display  Initial Impression / Assessment and Plan / UC Course  I have reviewed the triage vital signs and the nursing notes.  Pertinent labs & imaging results that were available  during my care of the patient were reviewed by me and considered in my medical decision making (see chart for details).     Viral illness Nothing concerning on exam X ray normal Testing for covid  and flu  OTC medicines as needed.  Follow up as needed for continued or worsening symptoms  Final Clinical Impressions(s) / UC Diagnoses   Final diagnoses:  Viral illness     Discharge Instructions     Your x ray was normal We are testing you for covid and flu You can take OTC medicines as needed.  Follow up as needed for continued or worsening symptoms     ED Prescriptions    None     PDMP not reviewed this encounter.   Janace Aris, NP 02/13/20 [phone]

## 2020-03-05 ENCOUNTER — Other Ambulatory Visit: Payer: Self-pay

## 2020-03-05 ENCOUNTER — Ambulatory Visit: Payer: BC Managed Care – PPO | Admitting: Family Medicine

## 2020-03-05 ENCOUNTER — Encounter: Payer: Self-pay | Admitting: Family Medicine

## 2020-03-05 VITALS — BP 100/74 | HR 79 | Temp 98.2°F | Ht 65.5 in | Wt 224.5 lb

## 2020-03-05 DIAGNOSIS — R5383 Other fatigue: Secondary | ICD-10-CM

## 2020-03-05 DIAGNOSIS — Z131 Encounter for screening for diabetes mellitus: Secondary | ICD-10-CM | POA: Diagnosis not present

## 2020-03-05 DIAGNOSIS — N912 Amenorrhea, unspecified: Secondary | ICD-10-CM | POA: Diagnosis not present

## 2020-03-05 DIAGNOSIS — R7989 Other specified abnormal findings of blood chemistry: Secondary | ICD-10-CM

## 2020-03-05 DIAGNOSIS — Z13220 Encounter for screening for lipoid disorders: Secondary | ICD-10-CM | POA: Diagnosis not present

## 2020-03-05 DIAGNOSIS — N939 Abnormal uterine and vaginal bleeding, unspecified: Secondary | ICD-10-CM | POA: Diagnosis not present

## 2020-03-05 LAB — CBC WITH DIFFERENTIAL/PLATELET
Basophils Absolute: 0.1 10*3/uL (ref 0.0–0.1)
Basophils Relative: 0.5 % (ref 0.0–3.0)
Eosinophils Absolute: 0.1 10*3/uL (ref 0.0–0.7)
Eosinophils Relative: 1 % (ref 0.0–5.0)
HCT: 44.2 % (ref 36.0–46.0)
Hemoglobin: 14.6 g/dL (ref 12.0–15.0)
Lymphocytes Relative: 16.3 % (ref 12.0–46.0)
Lymphs Abs: 2 10*3/uL (ref 0.7–4.0)
MCHC: 33.1 g/dL (ref 30.0–36.0)
MCV: 94.2 fl (ref 78.0–100.0)
Monocytes Absolute: 0.7 10*3/uL (ref 0.1–1.0)
Monocytes Relative: 5.4 % (ref 3.0–12.0)
Neutro Abs: 9.6 10*3/uL — ABNORMAL HIGH (ref 1.4–7.7)
Neutrophils Relative %: 76.8 % (ref 43.0–77.0)
Platelets: 281 10*3/uL (ref 150.0–400.0)
RBC: 4.7 Mil/uL (ref 3.87–5.11)
RDW: 12.9 % (ref 11.5–15.5)
WBC: 12.4 10*3/uL — ABNORMAL HIGH (ref 4.0–10.5)

## 2020-03-05 LAB — HEPATIC FUNCTION PANEL
ALT: 24 U/L (ref 0–35)
AST: 18 U/L (ref 0–37)
Albumin: 3.9 g/dL (ref 3.5–5.2)
Alkaline Phosphatase: 44 U/L (ref 39–117)
Bilirubin, Direct: 0.1 mg/dL (ref 0.0–0.3)
Total Bilirubin: 0.7 mg/dL (ref 0.2–1.2)
Total Protein: 6.3 g/dL (ref 6.0–8.3)

## 2020-03-05 LAB — HEMOGLOBIN A1C: Hgb A1c MFr Bld: 5.8 % (ref 4.6–6.5)

## 2020-03-05 LAB — BASIC METABOLIC PANEL
BUN: 8 mg/dL (ref 6–23)
CO2: 30 mEq/L (ref 19–32)
Calcium: 9 mg/dL (ref 8.4–10.5)
Chloride: 104 mEq/L (ref 96–112)
Creatinine, Ser: 0.68 mg/dL (ref 0.40–1.20)
GFR: 109.03 mL/min (ref >60.00)
Glucose, Bld: 95 mg/dL (ref 70–99)
Potassium: 4.2 mEq/L (ref 3.5–5.1)
Sodium: 139 mEq/L (ref 135–145)

## 2020-03-05 LAB — LIPID PANEL
Cholesterol: 164 mg/dL (ref 0–200)
HDL: 59.5 mg/dL (ref >39.00)
LDL Cholesterol: 78 mg/dL (ref 0–99)
NonHDL: 104.81
Total CHOL/HDL Ratio: 3
Triglycerides: 135 mg/dL (ref 0.0–149.0)
VLDL: 27 mg/dL (ref 0.0–40.0)

## 2020-03-05 LAB — TSH: TSH: 1.35 u[IU]/mL (ref 0.35–4.50)

## 2020-03-05 LAB — HCG, QUANTITATIVE, PREGNANCY: Quantitative HCG: <0.6 m[IU]/mL

## 2020-03-05 LAB — VITAMIN D 25 HYDROXY (VIT D DEFICIENCY, FRACTURES): VITD: 22.49 ng/mL — ABNORMAL LOW (ref 30.00–100.00)

## 2020-03-05 NOTE — Progress Notes (Signed)
Jerriah Ines T. Dasja Brase, MD, CAQ Sports Medicine  Primary Care and Sports Medicine [facility] [address] [place], [address]  Phone: [phone]  FAX: [phone]  AJAH VANHOOSE - 40 y.o. female  MRN [id number]  Date of Birth: [birth year]  Date: 03/05/2020  PCP: Hannah Beat, MD  Referral: Hannah Beat, MD  Chief Complaint  Patient presents with  . 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  Wants labs done.  . Irregular Spotting    With IUD-Wants to be checked for preganacy due to sore breast and nausea    This visit occurred during the SARS-CoV-2 public health emergency.  Safety protocols were in place, including screening questions prior to the visit, additional usage of staff PPE, and extensive cleaning of exam room while observing appropriate contact time as indicated for disinfecting solutions.   Subjective:   MILY MALECKI is a 40 y.o. very pleasant female patient with Body mass index is 36.79 kg/m. who presents with the following:  Question of possible pregnancy/spotting in the setting of having her IUD. Check pregnancy test.  Has some nausea, spotting, and her breasts are tender.  She does have a Mirena IUD.  GYN  Had it check, but it had maybe move.  Did have some bleeding with sex.  Breasts have been sore.   Check bhcg  She also needs that have some routine blood work including screening for diabetes, lipids, follow-up on vitamin D deficiency  Immunization History  Administered Date(s) Administered  . Influenza-Unspecified 10/11/2014, 10/27/2015, 09/27/2016, 10/17/2017, 10/16/2018  . PFIZER(Purple Top)SARS-COV-2 Vaccination 02/12/2019, 03/05/2019  . Tdap 01/14/2016     Review of Systems is noted in the HPI, as appropriate  Objective:   BP 100/74   Pulse 79   Temp 98.2 F (36.8 C) (Temporal)   Ht 5' 5.5" (1.664 m)   Wt 224 lb 8 oz (101.8 kg)   SpO2 97%   BMI 36.79 kg/m   GEN: No acute  distress; alert,appropriate. PULM: Breathing comfortably in no respiratory distress PSYCH: Normally interactive.  CV: RRR, no m/g/r   Laboratory and Imaging Data:  Assessment and Plan:     ICD-10-CM   1. Vaginal bleeding  N93.9 hCG, quantitative, pregnancy  2. Screening for diabetes mellitus  Z13.1 Hemoglobin A1c  3. Screening, lipid  Z13.220 Lipid panel  4. Other fatigue  R53.83 Basic metabolic panel    CBC with Differential/Platelet    Hepatic function panel    TSH  5. Amenorrhea  N91.2 hCG, quantitative, pregnancy  6. Low vitamin D level  R79.89 VITAMIN D 25 Hydroxy (Vit-D Deficiency, Fractures)   We need to confirm whether or not she is pregnant.  Additionally we will do basic screening labs.  No orders of the defined types were placed in this encounter.  Medications Discontinued During This Encounter  Medication Reason  . albuterol (VENTOLIN HFA) 108 (90 Base) MCG/ACT inhaler Completed Course  . benzonatate (TESSALON PERLES) 100 MG capsule Completed Course   Orders Placed This Encounter  Procedures  . Basic metabolic panel  . CBC with Differential/Platelet  . Hepatic function panel  . Hemoglobin A1c  . Lipid panel  . TSH  . hCG, quantitative, pregnancy  . VITAMIN D 25 Hydroxy (Vit-D Deficiency, Fractures)    Follow-up: No follow-ups on file.  Signed,  Elpidio Galea. Liane Tribbey, MD   Outpatient Encounter Medications as of 03/05/2020  Medication Sig  . [DISCONTINUED] albuterol (VENTOLIN HFA)  108 (90 Base) MCG/ACT inhaler Inhale into the lungs.  . [DISCONTINUED] benzonatate (TESSALON PERLES) 100 MG capsule Take 1 capsule (100 mg total) by mouth 3 (three) times daily as needed.  . [DISCONTINUED] levonorgestrel (MIRENA) 20 MCG/24HR IUD 1 Intra Uterine Device (1 each total) by Intrauterine route once.  . [DISCONTINUED] promethazine (PHENERGAN) 25 MG tablet TAKE 1 TABLET BY MOUTH EVERY 6 HOURS AS NEEDED (Patient not taking: No sig reported)  . [DISCONTINUED] rizatriptan  (MAXALT) 10 MG tablet TAKE 1 TABLET BY MOUTH AS NEEDED FOR MIGRAINE. MAY REPEAT IN 2 HOURS IF NEEDED (Patient not taking: No sig reported)   No facility-administered encounter medications on file as of 03/05/2020.

## 2022-06-17 IMAGING — DX DG CHEST 2V
2 series · 2 of 2 positions shown · non-contrast
Comparison: 11/25/2019

CLINICAL DATA: Cough, fever, and shortness of breath.

EXAM:
CHEST - 2 VIEW

[chest pa]
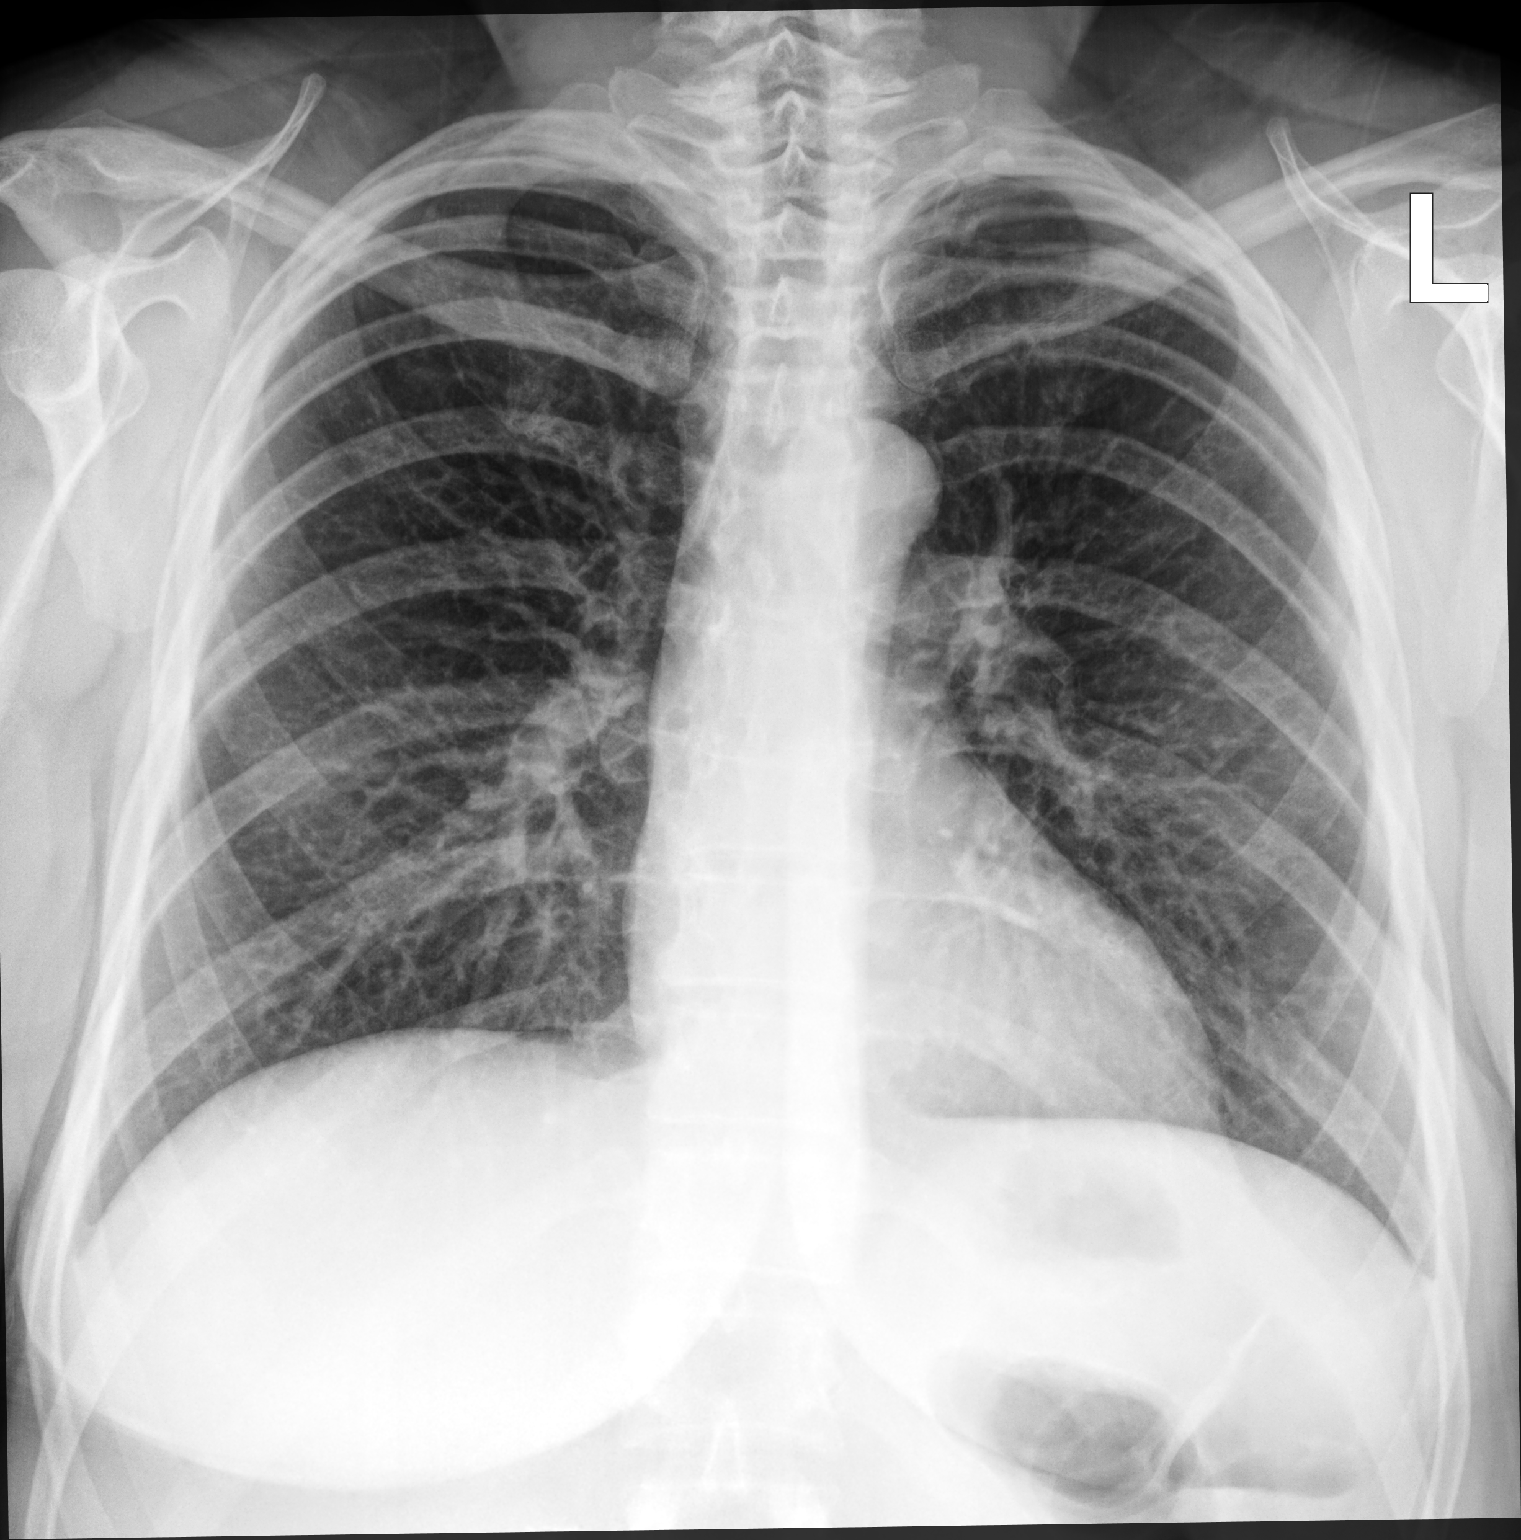

[chest lat]
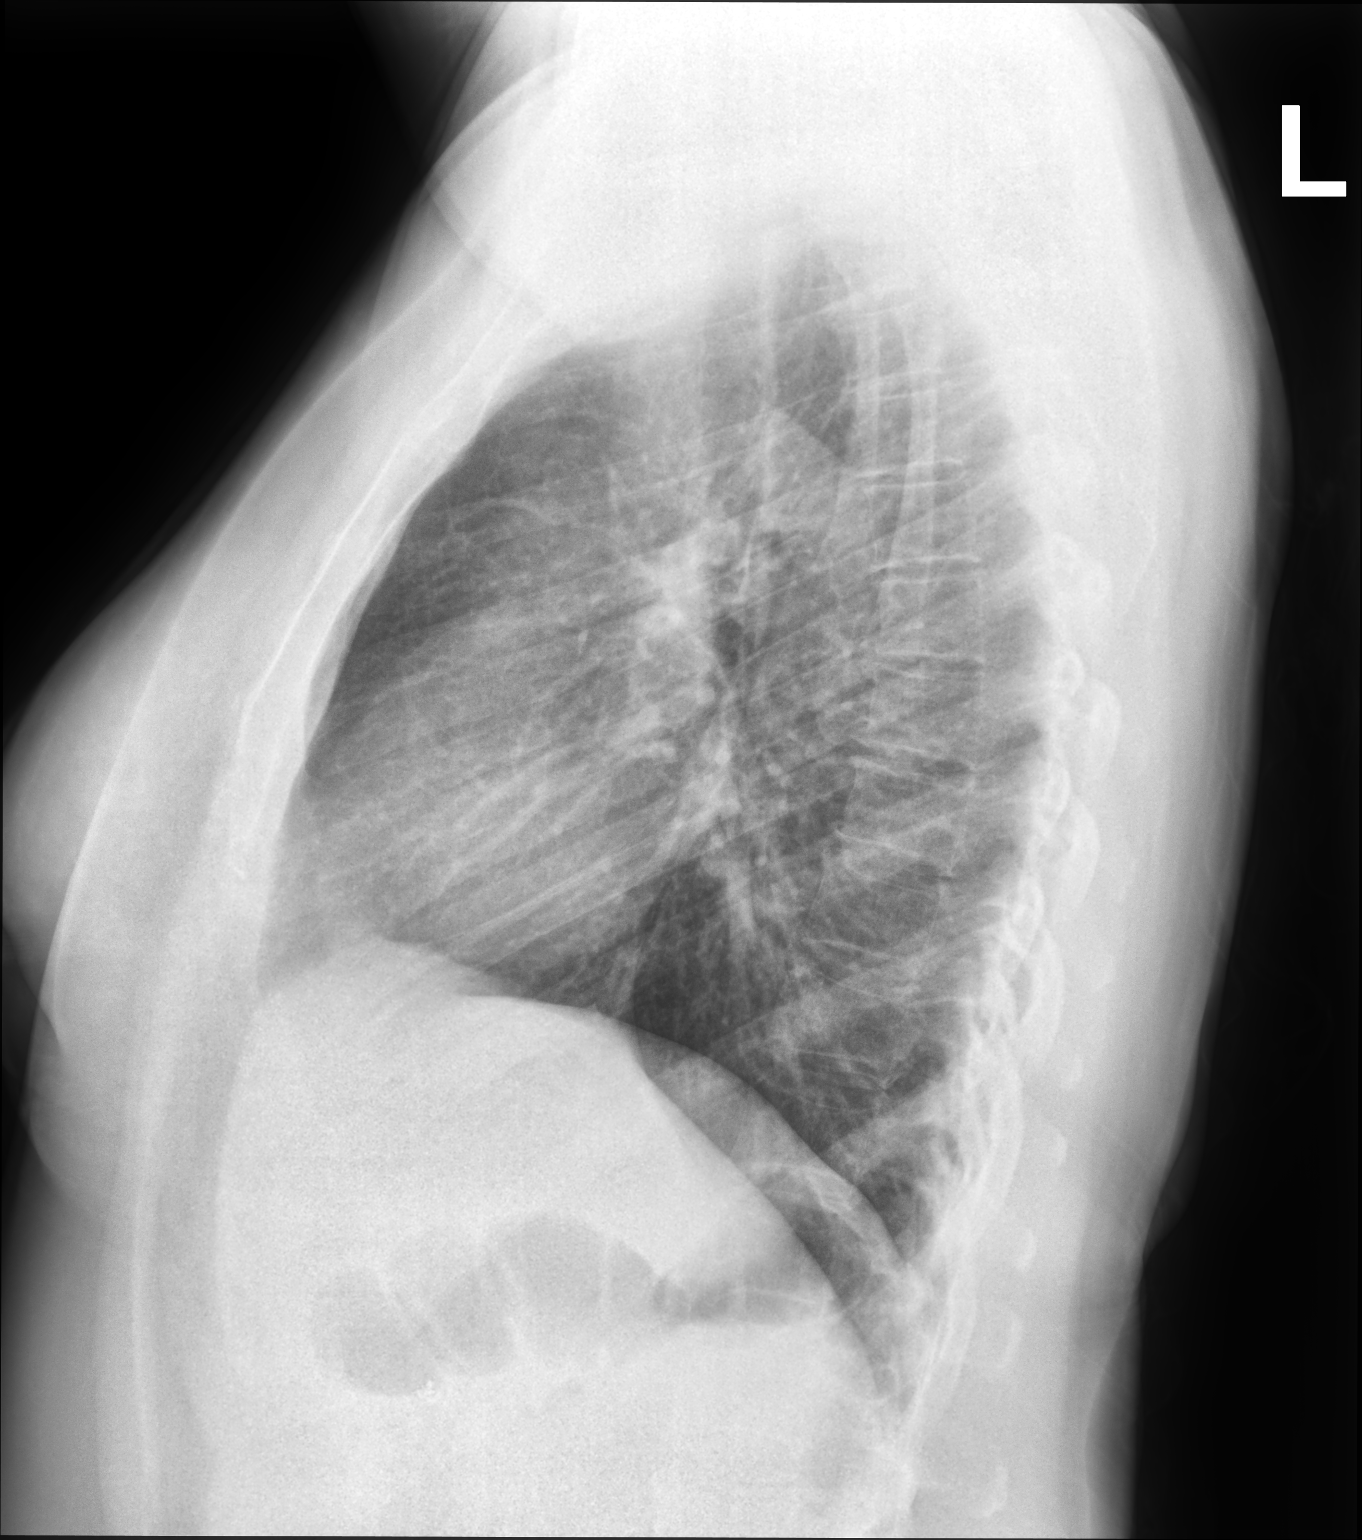

[2 of 2 positions shown; findings below may reference images not displayed]

FINDINGS: The heart size and mediastinal contours are within normal limits.
Both lungs are clear. The visualized skeletal structures are
unremarkable.
IMPRESSION: No active cardiopulmonary disease.

## 2022-07-18 ENCOUNTER — Other Ambulatory Visit: Payer: Self-pay | Admitting: Family Medicine

## 2022-07-18 DIAGNOSIS — E559 Vitamin D deficiency, unspecified: Secondary | ICD-10-CM

## 2022-07-18 DIAGNOSIS — Z131 Encounter for screening for diabetes mellitus: Secondary | ICD-10-CM

## 2022-07-18 DIAGNOSIS — Z13220 Encounter for screening for lipoid disorders: Secondary | ICD-10-CM

## 2022-07-18 DIAGNOSIS — R5383 Other fatigue: Secondary | ICD-10-CM

## 2022-07-28 ENCOUNTER — Other Ambulatory Visit (INDEPENDENT_AMBULATORY_CARE_PROVIDER_SITE_OTHER): Payer: PRIVATE HEALTH INSURANCE

## 2022-07-28 DIAGNOSIS — E559 Vitamin D deficiency, unspecified: Secondary | ICD-10-CM

## 2022-07-28 DIAGNOSIS — R5383 Other fatigue: Secondary | ICD-10-CM | POA: Diagnosis not present

## 2022-07-28 DIAGNOSIS — Z131 Encounter for screening for diabetes mellitus: Secondary | ICD-10-CM | POA: Diagnosis not present

## 2022-07-28 DIAGNOSIS — Z13220 Encounter for screening for lipoid disorders: Secondary | ICD-10-CM | POA: Diagnosis not present

## 2022-07-28 LAB — CBC WITH DIFFERENTIAL/PLATELET
Basophils Absolute: 0.1 10*3/uL (ref 0.0–0.1)
Basophils Relative: 0.6 % (ref 0.0–3.0)
Eosinophils Absolute: 0.1 10*3/uL (ref 0.0–0.7)
Eosinophils Relative: 0.9 % (ref 0.0–5.0)
HCT: 50.5 % — ABNORMAL HIGH (ref 36.0–46.0)
Hemoglobin: 16.3 g/dL — ABNORMAL HIGH (ref 12.0–15.0)
Lymphocytes Relative: 18.7 % (ref 12.0–46.0)
Lymphs Abs: 2.1 10*3/uL (ref 0.7–4.0)
MCHC: 32.3 g/dL (ref 30.0–36.0)
MCV: 94.9 fl (ref 78.0–100.0)
Monocytes Absolute: 0.6 10*3/uL (ref 0.1–1.0)
Monocytes Relative: 5.3 % (ref 3.0–12.0)
Neutro Abs: 8.5 10*3/uL — ABNORMAL HIGH (ref 1.4–7.7)
Neutrophils Relative %: 74.5 % (ref 43.0–77.0)
Platelets: 312 10*3/uL (ref 150.0–400.0)
RBC: 5.32 Mil/uL — ABNORMAL HIGH (ref 3.87–5.11)
RDW: 12.6 % (ref 11.5–15.5)
WBC: 11.5 10*3/uL — ABNORMAL HIGH (ref 4.0–10.5)

## 2022-07-28 LAB — BASIC METABOLIC PANEL
BUN: 8 mg/dL (ref 6–23)
CO2: 28 mEq/L (ref 19–32)
Calcium: 9.9 mg/dL (ref 8.4–10.5)
Chloride: 104 mEq/L (ref 96–112)
Creatinine, Ser: 0.69 mg/dL (ref 0.40–1.20)
GFR: 106.83 mL/min (ref >60.00)
Glucose, Bld: 98 mg/dL (ref 70–99)
Potassium: 3.6 mEq/L (ref 3.5–5.1)
Sodium: 141 mEq/L (ref 135–145)

## 2022-07-28 LAB — LIPID PANEL
Cholesterol: 159 mg/dL (ref 0–200)
HDL: 52 mg/dL (ref >39.00)
LDL Cholesterol: 85 mg/dL (ref 0–99)
NonHDL: 106.67
Total CHOL/HDL Ratio: 3
Triglycerides: 109 mg/dL (ref 0.0–149.0)
VLDL: 21.8 mg/dL (ref 0.0–40.0)

## 2022-07-28 LAB — HEMOGLOBIN A1C: Hgb A1c MFr Bld: 5.4 % (ref 4.6–6.5)

## 2022-07-28 LAB — HEPATIC FUNCTION PANEL
ALT: 20 U/L (ref 0–35)
AST: 19 U/L (ref 0–37)
Albumin: 4.4 g/dL (ref 3.5–5.2)
Alkaline Phosphatase: 42 U/L (ref 39–117)
Bilirubin, Direct: 0.2 mg/dL (ref 0.0–0.3)
Total Bilirubin: 1.1 mg/dL (ref 0.2–1.2)
Total Protein: 6.6 g/dL (ref 6.0–8.3)

## 2022-07-28 LAB — TSH: TSH: 1.13 u[IU]/mL (ref 0.35–5.50)

## 2022-07-28 LAB — VITAMIN D 25 HYDROXY (VIT D DEFICIENCY, FRACTURES): VITD: 38.95 ng/mL (ref 30.00–100.00)

## 2022-07-29 ENCOUNTER — Other Ambulatory Visit: Payer: Self-pay

## 2022-08-02 NOTE — Progress Notes (Signed)
Cheyenne Novella T. Valisa Karpel, MD, CAQ Sports Medicine [facility] at [facility] [address] [place], [address]  Phone: [phone]  FAX: [phone]  Cheyenne Rivera - 42 y.o. female  MRN [id number]  Date of Birth: 11/11/1980  Date: 08/04/2022  PCP: Hannah Beat, MD  Referral: Hannah Beat, MD  Chief Complaint  Patient presents with   Annual Exam   Patient Care Team: Hannah Beat, MD as PCP - General Subjective:   Cheyenne Rivera is a 42 y.o. pleasant patient who presents with the following:  Health Maintenance Summary Reviewed and updated, unless pt declines services.  Tobacco History Reviewed. Non-smoker Alcohol: No concerns, no excessive use - only once in a great while Exercise Habits: Some activity, rec at least 30 mins 5 times a week STD concerns: none Drug Use: None Lumps or breast concerns: no  Pap - 2020, not due until 2025 Covid booster next year  [facility]  Feels congested and allergies a lot right now - moving to a new house this weekend  Has an albuterol inhaler at home - does have some asthma at baseline  Wt Readings from Last 3 Encounters:  08/04/22 180 lb 6 oz (81.8 kg)  03/05/20 224 lb 8 oz (101.8 kg)  11/25/19 224 lb (101.6 kg)   - lost 65 pounds  Compounding pharmacy -  [facility] 367 month - on 15 mg  42 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 Cheyenne Rivera is 42 years old and playing softball  Health Maintenance  Topic Date Due   COVID-19 Vaccine (3 - 2023-24 season) 08/21/2022 (Originally 09/10/2021)   INFLUENZA VACCINE  08/11/2022   PAP SMEAR-Modifier  02/28/2023   DTaP/Tdap/Td (2 - Td or Tdap) 01/13/2026   Hepatitis C Screening  Completed   HIV Screening  Completed   HPV VACCINES  Aged Out    Immunization History  Administered Date(s) Administered   Influenza-Unspecified 10/11/2014, 10/27/2015, 09/27/2016, 10/17/2017, 10/16/2018   PFIZER(Purple Top)SARS-COV-2 Vaccination 02/12/2019,  03/05/2019   Tdap 01/14/2016   Patient Active Problem List   Diagnosis Date Noted   Vitamin D deficiency 03/06/2018   IBS (irritable bowel syndrome) 01/15/2016   Severe obesity (BMI >= 40) (HCC) 01/15/2016   Migraine headache 08/22/2011   Family history of breast cancer 06/09/2010   BACK PAIN 06/04/2009   GAD (generalized anxiety disorder) 05/05/2008   Depression, major, in remission (HCC) 05/05/2008    Past Medical History:  Diagnosis Date   Anxiety    Depression    Family history of breast cancer 06/09/2010   IBS (irritable bowel syndrome) 01/15/2016   Migraine     Past Surgical History:  Procedure Laterality Date   CHOLECYSTECTOMY     INTRAUTERINE DEVICE INSERTION  03/2007   Mirena   LEEP  3/03   TONSILLECTOMY      Family History  Problem Relation Age of Onset   Alcohol abuse Father    Sudden death Other        >50, Grand father, ?cause   Breast cancer Maternal Grandmother        39's ?   Colon cancer Neg Hx    Ovarian cancer Neg Hx    Lung cancer Neg Hx    Prostate cancer Neg Hx    Coronary artery disease Neg Hx    Stroke Neg Hx    Diabetes Neg Hx    Mental illness Neg Hx     Social History   Social History Narrative   Not  on file    Past Medical History, Surgical History, Social History, Family History, Problem List, Medications, and Allergies have been reviewed and updated if relevant.  Review of Systems: Pertinent positives are listed above.  Otherwise, a full 14 point review of systems has been done in full and it is negative except where it is noted positive.  Objective:   BP 100/80 (BP Location: Left Arm, Patient Position: Sitting, Cuff Size: Normal)   Pulse 78   Temp 98.3 F (36.8 C) (Temporal)   Ht 5' 5.25" (1.657 m)   Wt 180 lb 6 oz (81.8 kg)   SpO2 97%   BMI 29.79 kg/m  Ideal Body Weight: Weight in (lb) to have BMI = 25: 151.1 No results found.    08/04/2022    4:05 PM  Depression screen PHQ 2/9  Decreased Interest 0  Down,  Depressed, Hopeless 1  PHQ - 2 Score 1     GEN: well developed, well nourished, no acute distress Eyes: conjunctiva and lids normal, PERRLA, EOMI ENT: TM clear, nares clear, oral exam WNL Neck: supple, no lymphadenopathy, no thyromegaly, no JVD Pulm: clear to auscultation and percussion, respiratory effort normal CV: regular rate and rhythm, S1-S2, no murmur, rub or gallop, no bruits Chest: no scars, masses, no lumps BREAST: breast exam declined GI: soft, non-tender; no hepatosplenomegaly, masses; active bowel sounds all quadrants GU: GU exam declined Lymph: no cervical, axillary or inguinal adenopathy MSK: gait normal, muscle tone and strength WNL, no joint swelling, effusions, discoloration, crepitus  SKIN: clear, good turgor, color WNL, no rashes, lesions, or ulcerations Neuro: normal mental status, normal strength, sensation, and motion Psych: alert; oriented to person, place and time, normally interactive and not anxious or depressed in appearance.   All labs reviewed with patient. Results for orders placed or performed in visit on 07/28/22  VITAMIN D 25 Hydroxy (Vit-D Deficiency, Fractures)  Result Value Ref Range   VITD 38.95 30.00 - 100.00 ng/mL  TSH  Result Value Ref Range   TSH 1.13 0.35 - 5.50 uIU/mL  Lipid panel  Result Value Ref Range   Cholesterol 159 0 - 200 mg/dL   Triglycerides 295.2 0.0 - 149.0 mg/dL   HDL 84.13 >24.40 mg/dL   VLDL 10.2 0.0 - 72.5 mg/dL   LDL Cholesterol 85 0 - 99 mg/dL   Total CHOL/HDL Ratio 3    NonHDL 106.67   Hemoglobin A1c  Result Value Ref Range   Hgb A1c MFr Bld 5.4 4.6 - 6.5 %  Hepatic function panel  Result Value Ref Range   Total Bilirubin 1.1 0.2 - 1.2 mg/dL   Bilirubin, Direct 0.2 0.0 - 0.3 mg/dL   Alkaline Phosphatase 42 39 - 117 U/L   AST 19 0 - 37 U/L   ALT 20 0 - 35 U/L   Total Protein 6.6 6.0 - 8.3 g/dL   Albumin 4.4 3.5 - 5.2 g/dL  CBC with Differential/Platelet  Result Value Ref Range   WBC 11.5 (H) 4.0 - 10.5  K/uL   RBC 5.32 (H) 3.87 - 5.11 Mil/uL   Hemoglobin 16.3 (H) 12.0 - 15.0 g/dL   HCT 36.6 (H) 44.0 - 34.7 %   MCV 94.9 78.0 - 100.0 fl   MCHC 32.3 30.0 - 36.0 g/dL   RDW 42.5 95.6 - 38.7 %   Platelets 312.0 150.0 - 400.0 K/uL   Neutrophils Relative % 74.5 43.0 - 77.0 %   Lymphocytes Relative 18.7 12.0 - 46.0 %   Monocytes Relative  5.3 3.0 - 12.0 %   Eosinophils Relative 0.9 0.0 - 5.0 %   Basophils Relative 0.6 0.0 - 3.0 %   Neutro Abs 8.5 (H) 1.4 - 7.7 K/uL   Lymphs Abs 2.1 0.7 - 4.0 K/uL   Monocytes Absolute 0.6 0.1 - 1.0 K/uL   Eosinophils Absolute 0.1 0.0 - 0.7 K/uL   Basophils Absolute 0.1 0.0 - 0.1 K/uL  Basic metabolic panel  Result Value Ref Range   Sodium 141 135 - 145 mEq/L   Potassium 3.6 3.5 - 5.1 mEq/L   Chloride 104 96 - 112 mEq/L   CO2 28 19 - 32 mEq/L   Glucose, Bld 98 70 - 99 mg/dL   BUN 8 6 - 23 mg/dL   Creatinine, Ser 4.74 0.40 - 1.20 mg/dL   GFR 259.56 >38.75 mL/min   Calcium 9.9 8.4 - 10.5 mg/dL   No results found.  Assessment and Plan:     ICD-10-CM   1. Healthcare maintenance  Z00.00      She is doing well, and she has lost almost 70 pounds.  She is taking Mounjaro from a Set designer.  Mildly elevated hemoglobin, we will have to track this over time.  At this point, not really think that we need to do anything further.  She wants to hold off on doing any kind of further COVID vaccination.  Health Maintenance Exam: The patient's preventative maintenance and recommended screening tests for an annual wellness exam were reviewed in full today. Brought up to date unless services declined.  Counselled on the importance of diet, exercise, and its role in overall health and mortality. The patient's FH and SH was reviewed, including their home life, tobacco status, and drug and alcohol status.  Follow-up in 1 year for physical exam or additional follow-up below.  Disposition: No follow-ups on file.  Future Appointments  Date Time Provider  Department Center  08/10/2023  7:30 AM [facility]  08/17/2023  2:45 PM Tamir Wallman, Karleen Hampshire, MD [facility]    Meds ordered this encounter  Medications   rizatriptan (MAXALT) 10 MG tablet    Sig: Take 1 tablet (10 mg total) by mouth as needed for migraine. May repeat in 2 hours if needed    Dispense:  10 tablet    Refill:  11   promethazine (PHENERGAN) 25 MG tablet    Sig: Take 1 tablet (25 mg total) by mouth every 6 (six) hours as needed for nausea or vomiting.    Dispense:  30 tablet    Refill:  3   Medications Discontinued During This Encounter  Medication Reason   promethazine (PHENERGAN) 25 MG tablet Reorder   rizatriptan (MAXALT) 10 MG tablet Reorder   No orders of the defined types were placed in this encounter.   Signed,  Elpidio Galea. Dyllan Kats, MD   Allergies as of 08/04/2022       Reactions   Codeine Other (See Comments)   Morphine    REACTION: rash and nausea        Medication List        Accurate as of August 04, 2022 11:59 PM. If you have any questions, ask your nurse or doctor.          levonorgestrel 20 MCG/DAY Iud Commonly known as: MIRENA 1 each by Intrauterine route once.   Mounjaro 15 MG/0.5ML Pen Generic drug: tirzepatide Inject 15 mg into the skin every 14 (fourteen) days.   promethazine 25 MG tablet Commonly known  as: PHENERGAN Take 1 tablet (25 mg total) by mouth every 6 (six) hours as needed for nausea or vomiting.   rizatriptan 10 MG tablet Commonly known as: MAXALT Take 1 tablet (10 mg total) by mouth as needed for migraine. May repeat in 2 hours if needed

## 2022-08-04 ENCOUNTER — Encounter: Payer: Self-pay | Admitting: Family Medicine

## 2022-08-04 ENCOUNTER — Ambulatory Visit (INDEPENDENT_AMBULATORY_CARE_PROVIDER_SITE_OTHER): Payer: PRIVATE HEALTH INSURANCE | Admitting: Family Medicine

## 2022-08-04 VITALS — BP 100/80 | HR 78 | Temp 98.3°F | Ht 65.25 in | Wt 180.4 lb

## 2022-08-04 DIAGNOSIS — Z0000 Encounter for general adult medical examination without abnormal findings: Secondary | ICD-10-CM | POA: Diagnosis not present

## 2022-08-04 MED ORDER — RIZATRIPTAN BENZOATE 10 MG PO TABS: 10.0000 mg | ORAL_TABLET | ORAL | 11 refills | Status: AC | PRN

## 2022-08-04 MED ORDER — PROMETHAZINE HCL 25 MG PO TABS: 25.0000 mg | ORAL_TABLET | Freq: Four times a day (QID) | ORAL | 3 refills | Status: AC | PRN

## 2022-08-04 NOTE — Patient Instructions (Signed)
  You do not need a referral to make a mammogram appointment, and you may call to make her own mammogram appointment directly around your schedule.  MAMMOGRAPHY IN [place]:  [facility] [phone] [address] [place], [place] [address]  [facility] (Formerly [facility]) [address] [place], [place] [address] Phone: [phone] Toll Free: [phone]  MAMMOGRAPHY IN [place]:  [facility] ([place] or [place]) [phone] Located on the campus of [facility] ([place])  [facility] ([place] [facility]) [address]  [place], [place] [address]  

## 2022-08-05 ENCOUNTER — Encounter: Payer: Self-pay | Admitting: Family Medicine

## 2022-11-29 ENCOUNTER — Encounter: Payer: Self-pay | Admitting: Family Medicine

## 2022-11-30 NOTE — Telephone Encounter (Signed)
Can you do a work note for Vicki for 11/18 - 11/20 due to illness with a return to work on 11/21.

## 2023-05-16 DIAGNOSIS — G43111 Migraine with aura, intractable, with status migrainosus: Secondary | ICD-10-CM | POA: Diagnosis not present

## 2023-05-16 DIAGNOSIS — Z30432 Encounter for removal of intrauterine contraceptive device: Secondary | ICD-10-CM | POA: Diagnosis not present

## 2023-05-17 DIAGNOSIS — Z59819 Housing instability, housed unspecified: Secondary | ICD-10-CM | POA: Diagnosis not present

## 2023-05-17 DIAGNOSIS — F419 Anxiety disorder, unspecified: Secondary | ICD-10-CM | POA: Diagnosis not present

## 2023-05-17 DIAGNOSIS — R519 Headache, unspecified: Secondary | ICD-10-CM | POA: Diagnosis not present

## 2023-05-17 DIAGNOSIS — H538 Other visual disturbances: Secondary | ICD-10-CM | POA: Diagnosis not present

## 2023-05-17 DIAGNOSIS — R110 Nausea: Secondary | ICD-10-CM | POA: Diagnosis not present

## 2023-05-23 ENCOUNTER — Other Ambulatory Visit: Payer: Self-pay

## 2023-05-23 ENCOUNTER — Encounter: Payer: Self-pay | Admitting: Emergency Medicine

## 2023-05-23 ENCOUNTER — Emergency Department
Admission: EM | Admit: 2023-05-23 | Discharge: 2023-05-23 | Disposition: A | Discharge status: Home / Self Care | Attending: Emergency Medicine | Admitting: Emergency Medicine

## 2023-05-23 ENCOUNTER — Emergency Department

## 2023-05-23 DIAGNOSIS — G43901 Migraine, unspecified, not intractable, with status migrainosus: Secondary | ICD-10-CM | POA: Diagnosis not present

## 2023-05-23 DIAGNOSIS — M25512 Pain in left shoulder: Secondary | ICD-10-CM | POA: Insufficient documentation

## 2023-05-23 DIAGNOSIS — R519 Headache, unspecified: Secondary | ICD-10-CM | POA: Diagnosis not present

## 2023-05-23 DIAGNOSIS — G43001 Migraine without aura, not intractable, with status migrainosus: Secondary | ICD-10-CM | POA: Diagnosis not present

## 2023-05-23 LAB — CBC WITH DIFFERENTIAL/PLATELET
Abs Immature Granulocytes: 0.1 10*3/uL — ABNORMAL HIGH (ref 0.00–0.07)
Basophils Absolute: 0.1 10*3/uL (ref 0.0–0.1)
Basophils Relative: 1 %
Eosinophils Absolute: 0.2 10*3/uL (ref 0.0–0.5)
Eosinophils Relative: 1 %
HCT: 44.7 % (ref 36.0–46.0)
Hemoglobin: 15.2 g/dL — ABNORMAL HIGH (ref 12.0–15.0)
Immature Granulocytes: 1 %
Lymphocytes Relative: 18 %
Lymphs Abs: 3.1 10*3/uL (ref 0.7–4.0)
MCH: 32 pg (ref 26.0–34.0)
MCHC: 34 g/dL (ref 30.0–36.0)
MCV: 94.1 fL (ref 80.0–100.0)
Monocytes Absolute: 0.7 10*3/uL (ref 0.1–1.0)
Monocytes Relative: 4 %
Neutro Abs: 12.6 10*3/uL — ABNORMAL HIGH (ref 1.7–7.7)
Neutrophils Relative %: 75 %
Platelets: 290 10*3/uL (ref 150–400)
RBC: 4.75 MIL/uL (ref 3.87–5.11)
RDW: 11.9 % (ref 11.5–15.5)
WBC: 16.7 10*3/uL — ABNORMAL HIGH (ref 4.0–10.5)
nRBC: 0 % (ref 0.0–0.2)

## 2023-05-23 LAB — BASIC METABOLIC PANEL WITH GFR
Anion gap: 7 (ref 5–15)
BUN: 10 mg/dL (ref 6–20)
CO2: 28 mmol/L (ref 22–32)
Calcium: 8.9 mg/dL (ref 8.9–10.3)
Chloride: 106 mmol/L (ref 98–111)
Creatinine, Ser: 0.73 mg/dL (ref 0.44–1.00)
GFR, Estimated: >60 mL/min (ref >60)
Glucose, Bld: 122 mg/dL — ABNORMAL HIGH (ref 70–99)
Potassium: 4 mmol/L (ref 3.5–5.1)
Sodium: 141 mmol/L (ref 135–145)

## 2023-05-23 MED ORDER — LACTATED RINGERS IV BOLUS
1000.0000 mL | Freq: Once | INTRAVENOUS | Status: AC
  Administered 2023-05-23: 1000 mL via INTRAVENOUS

## 2023-05-23 MED ORDER — DIVALPROEX SODIUM 250 MG PO DR TAB: 250.0000 mg | DELAYED_RELEASE_TABLET | Freq: Two times a day (BID) | ORAL | 0 refills | Status: DC

## 2023-05-23 MED ORDER — METOCLOPRAMIDE HCL 10 MG PO TABS: 10.0000 mg | ORAL_TABLET | Freq: Four times a day (QID) | ORAL | 0 refills | Status: DC | PRN

## 2023-05-23 MED ORDER — KETOROLAC TROMETHAMINE 15 MG/ML IJ SOLN
15.0000 mg | Freq: Once | INTRAMUSCULAR | Status: AC
  Administered 2023-05-23: 15 mg via INTRAVENOUS
  Filled 2023-05-23: qty 1

## 2023-05-23 MED ORDER — METOCLOPRAMIDE HCL 5 MG/ML IJ SOLN
10.0000 mg | INTRAMUSCULAR | Status: AC
  Administered 2023-05-23: 10 mg via INTRAVENOUS
  Filled 2023-05-23: qty 2

## 2023-05-23 MED ORDER — VALPROATE SODIUM 100 MG/ML IV SOLN
500.0000 mg | Freq: Once | INTRAVENOUS | Status: AC
  Administered 2023-05-23: 500 mg via INTRAVENOUS
  Filled 2023-05-23: qty 5

## 2023-05-23 MED ORDER — MAGNESIUM SULFATE 2 GM/50ML IV SOLN
2.0000 g | INTRAVENOUS | Status: AC
  Administered 2023-05-23: 2 g via INTRAVENOUS
  Filled 2023-05-23: qty 50

## 2023-05-23 NOTE — ED Provider Notes (Addendum)
 [facility] Provider Note    Event Date/Time   First MD Initiated Contact with Patient 05/23/23 1214     (approximate)   History   Chief Complaint: Headache   HPI  Cheyenne Rivera is a 43 y.o. female with a history of IBS, migraines, anxiety who comes to the ED complaining of headache for the past week.  Left retro-orbital, no neck pain or fever.  No trauma.  Saw [facility] ED a few days ago, was treated supportively with temporary improvement of symptoms which have now worsened again.  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 She notes that compazine and phenergan  help with headache, but they cause drowsiness and after she sleeps, headache recurs. She last took medications yesterday morning. Went out for drinks with a friend last night while also temporarily felt better but headache was recurrent this morning. Also notes high stress levels lately due to need to find an apartment and high cost of rent.      Past Medical History:  Diagnosis Date   Anxiety    Depression    Family history of breast cancer 06/09/2010   IBS (irritable bowel syndrome) 01/15/2016   Migraine     Current Outpatient Rx   Order #: [id number] Class: Normal   Order #: [id number] Class: Normal   Order #: [id number] Class: Historical Med   Order #: [id number] Class: Normal   Order #: [id number] Class: Normal   Order #: [id number] Class: Historical Med    Past Surgical History:  Procedure Laterality Date   CHOLECYSTECTOMY     LEEP  03/10/2001   TONSILLECTOMY      Physical Exam   Triage Vital Signs: ED Triage Vitals  Encounter Vitals Group     BP 05/23/23 1149 (!) 152/101     Systolic BP Percentile --      Diastolic BP Percentile --      Pulse Rate 05/23/23 1149 81     Resp 05/23/23 1149 17     Temp 05/23/23 1149 98.4 F (36.9 C)     Temp Source 05/23/23 1149 Oral     SpO2 05/23/23 1149 96 %      Weight 05/23/23 1150 202 lb (91.6 kg)     Height 05/23/23 1150 5\' 5"  (1.651 m)     Head Circumference --      Peak Flow --      Pain Score 05/23/23 1150 5     Pain Loc --      Pain Education --      Exclude from Growth Chart --     Most recent vital signs: Vitals:   05/23/23 1430 05/23/23 1500  BP: (!) 98/51 (!) 94/51  Pulse: 67 65  Resp:  18  Temp:    SpO2: 97% 96%    General: Awake, no distress.  CV:  Good peripheral perfusion.  Regular rate rhythm Resp:  Normal effort.  Clear to auscultation bilaterally Abd:  No distention.  Other:  Cranial nerves II through XII intact.  No nystagmus.  No midline spinal tenderness.  There is tenderness and tenseness over the left there is tenderness over the left rhomboids which reproduces her shoulder pain.   ED Results / Procedures / Treatments   Labs (all labs ordered are listed, but only abnormal results are displayed) Labs Reviewed  CBC WITH DIFFERENTIAL/PLATELET - Abnormal; Notable for the following components:  Result Value   WBC 16.7 (*)    Hemoglobin 15.2 (*)    Neutro Abs 12.6 (*)    Abs Immature Granulocytes 0.10 (*)    All other components within normal limits  BASIC METABOLIC PANEL WITH GFR - Abnormal; Notable for the following components:   Glucose, Bld 122 (*)    All other components within normal limits     EKG    RADIOLOGY X-ray left shoulder interpreted by me, appears normal.  Radiology report reviewed.  CT head unremarkable   PROCEDURES:  Procedures   MEDICATIONS ORDERED IN ED: Medications  valproate (DEPACON) 500 mg in dextrose 5 % 50 mL IVPB (500 mg Intravenous New Bag/Given 05/23/23 1420)  lactated ringers bolus 1,000 mL (0 mLs Intravenous Stopped 05/23/23 1513)  ketorolac  (TORADOL ) 15 MG/ML injection 15 mg (15 mg Intravenous Given 05/23/23 1356)  metoCLOPramide (REGLAN) injection 10 mg (10 mg Intravenous Given 05/23/23 1357)  magnesium sulfate IVPB 2 g 50 mL (0 g Intravenous Stopped 05/23/23  1514)     IMPRESSION / MDM / ASSESSMENT AND PLAN / ED COURSE  I reviewed the triage vital signs and the nursing notes.  DDx: Intracranial mass, intracranial hemorrhage, dehydration, AKI, electrolyte derangement, musculoskeletal pain  Patient's presentation is most consistent with acute presentation with potential threat to life or bodily function.     Clinical Course as of 05/23/23 1518  Tue May 23, 2023  1326 Tenderness at left rhomboids reproducing the shoulder pain.  Presentation consistent with status migrainosus.  CT head normal.  No evidence of meningitis encephalitis or trauma.  Doubt dissection or stroke.  Will treat supportively. [PS]    Clinical Course User Index [PS] Jacquie Maudlin, MD    ----------------------------------------- 3:17 PM on 05/23/2023 ----------------------------------------- Feeling somewhat improved.  Remains nontoxic, normal mental status.  Doubt sinus thrombosis or aneurysm.  Offered admission, but she feels comfortable with continued outpatient management for now.  Will add Depakote and Reglan to replace Phenergan  and Compazine for daytime use.  Recommend follow-up with neurology.   FINAL CLINICAL IMPRESSION(S) / ED DIAGNOSES   Final diagnoses:  Status migrainosus     Rx / DC Orders   ED Discharge Orders          Ordered    metoCLOPramide (REGLAN) 10 MG tablet  Every 6 hours PRN        05/23/23 1515    divalproex (DEPAKOTE) 250 MG DR tablet  2 times daily        05/23/23 1515             Note:  This document was prepared using Dragon voice recognition software and may include unintentional dictation errors.   Jacquie Maudlin, MD 05/23/23 1446    Jacquie Maudlin, MD 05/23/23 [phone]

## 2023-05-23 NOTE — ED Triage Notes (Signed)
 Patient to ED via POV for headache x1 week. Seen at [facility] ED last week for same. Behind left eye and causing eye twitching. Taking pain and nausea meds as prescribed which are just making her sleep. No also having left shoulder pain. No known injury. Hurt to lift things.

## 2023-05-31 ENCOUNTER — Ambulatory Visit: Payer: PRIVATE HEALTH INSURANCE | Admitting: Family Medicine

## 2023-06-06 NOTE — Progress Notes (Unsigned)
     Cheyenne Rivera T. Cheyenne Alwine, MD, CAQ Sports Medicine [facility] at [facility] [address] [place], [address]  Phone: [phone]  FAX: [phone]  Cheyenne Rivera - 43 y.o. female  MRN [id number]  Date of Birth: 1980-04-14  Date: 06/07/2023  PCP: Cheyenne Curt, MD  Referral: Cheyenne Curt, MD  No chief complaint on file.  Subjective:   Cheyenne Rivera is a 43 y.o. very pleasant female patient with There is no height or weight on file to calculate BMI. who presents with the following:  Cheyenne Rivera is a very pleasant young female who has a history of migraine headaches.  She is on Depakote  250 mg a day along with as needed Maxalt , Phenergan , Reglan .    Review of Systems is noted in the HPI, as appropriate  Objective:   There were no vitals taken for this visit.  GEN: No acute distress; alert,appropriate. PULM: Breathing comfortably in no respiratory distress PSYCH: Normally interactive.   Laboratory and Imaging Data:  Assessment and Plan:   ***

## 2023-06-07 ENCOUNTER — Ambulatory Visit (INDEPENDENT_AMBULATORY_CARE_PROVIDER_SITE_OTHER): Payer: PRIVATE HEALTH INSURANCE | Admitting: Family Medicine

## 2023-06-07 ENCOUNTER — Encounter: Payer: Self-pay | Admitting: Family Medicine

## 2023-06-07 VITALS — BP 130/80 | HR 70 | Temp 98.9°F | Ht 65.25 in | Wt 207.0 lb

## 2023-06-07 DIAGNOSIS — F411 Generalized anxiety disorder: Secondary | ICD-10-CM

## 2023-06-07 DIAGNOSIS — G43001 Migraine without aura, not intractable, with status migrainosus: Secondary | ICD-10-CM | POA: Diagnosis not present

## 2023-06-07 MED ORDER — BUSPIRONE HCL 5 MG PO TABS: 5.0000 mg | ORAL_TABLET | Freq: Two times a day (BID) | ORAL | 2 refills | Status: DC

## 2023-06-07 MED ORDER — TOPIRAMATE 50 MG PO TABS: 50.0000 mg | ORAL_TABLET | Freq: Two times a day (BID) | ORAL | 3 refills | Status: DC

## 2023-06-07 MED ORDER — TOPIRAMATE 25 MG PO TABS: ORAL_TABLET | ORAL | 0 refills | Status: DC

## 2023-06-07 NOTE — Patient Instructions (Addendum)
 Begin with 25 mg PO every evening for 1 week.  Increase to 25 mg PO twice daily for the second week.  Then, 25 mg PO every morning and 50 mg PO every evening for the third week.  Finally, 50 mg PO twice daily from the fourth week onward.   [email or web address]

## 2023-07-12 ENCOUNTER — Encounter: Payer: Self-pay | Admitting: Family Medicine

## 2023-07-12 MED ORDER — TOPIRAMATE 50 MG PO TABS: 50.0000 mg | ORAL_TABLET | Freq: Two times a day (BID) | ORAL | 3 refills | Status: DC

## 2023-07-17 ENCOUNTER — Telehealth: Payer: Self-pay | Admitting: *Deleted

## 2023-07-17 DIAGNOSIS — R5383 Other fatigue: Secondary | ICD-10-CM

## 2023-07-17 DIAGNOSIS — Z13220 Encounter for screening for lipoid disorders: Secondary | ICD-10-CM

## 2023-07-17 DIAGNOSIS — E559 Vitamin D deficiency, unspecified: Secondary | ICD-10-CM

## 2023-07-17 DIAGNOSIS — Z131 Encounter for screening for diabetes mellitus: Secondary | ICD-10-CM

## 2023-07-17 NOTE — Telephone Encounter (Signed)
-----   Message from Harlene Du sent at 07/13/2023 11:13 AM EDT ----- Regarding: Labs Thurs 08/10/23 Hello,  Patient is coming in for CPE labs on Thursday 08/10/23. Can we get orders please.   Thanks

## 2023-08-10 ENCOUNTER — Other Ambulatory Visit: Payer: PRIVATE HEALTH INSURANCE

## 2023-08-10 ENCOUNTER — Other Ambulatory Visit (INDEPENDENT_AMBULATORY_CARE_PROVIDER_SITE_OTHER): Payer: Self-pay

## 2023-08-10 DIAGNOSIS — Z13220 Encounter for screening for lipoid disorders: Secondary | ICD-10-CM

## 2023-08-10 DIAGNOSIS — E559 Vitamin D deficiency, unspecified: Secondary | ICD-10-CM

## 2023-08-10 DIAGNOSIS — Z131 Encounter for screening for diabetes mellitus: Secondary | ICD-10-CM

## 2023-08-10 DIAGNOSIS — R5383 Other fatigue: Secondary | ICD-10-CM

## 2023-08-10 LAB — CBC WITH DIFFERENTIAL/PLATELET
Basophils Absolute: 0.1 K/uL (ref 0.0–0.1)
Basophils Relative: 0.8 % (ref 0.0–3.0)
Eosinophils Absolute: 0.5 K/uL (ref 0.0–0.7)
Eosinophils Relative: 5.3 % — ABNORMAL HIGH (ref 0.0–5.0)
HCT: 44.3 % (ref 36.0–46.0)
Hemoglobin: 14.7 g/dL (ref 12.0–15.0)
Lymphocytes Relative: 28.6 % (ref 12.0–46.0)
Lymphs Abs: 2.5 K/uL (ref 0.7–4.0)
MCHC: 33.2 g/dL (ref 30.0–36.0)
MCV: 93.5 fl (ref 78.0–100.0)
Monocytes Absolute: 0.5 K/uL (ref 0.1–1.0)
Monocytes Relative: 6.2 % (ref 3.0–12.0)
Neutro Abs: 5.3 K/uL (ref 1.4–7.7)
Neutrophils Relative %: 59.1 % (ref 43.0–77.0)
Platelets: 266 K/uL (ref 150.0–400.0)
RBC: 4.74 Mil/uL (ref 3.87–5.11)
RDW: 12.7 % (ref 11.5–15.5)
WBC: 8.9 K/uL (ref 4.0–10.5)

## 2023-08-10 LAB — BASIC METABOLIC PANEL WITH GFR
BUN: 9 mg/dL (ref 6–23)
CO2: 24 meq/L (ref 19–32)
Calcium: 8.8 mg/dL (ref 8.4–10.5)
Chloride: 109 meq/L (ref 96–112)
Creatinine, Ser: 0.73 mg/dL (ref 0.40–1.20)
GFR: 100.5 mL/min (ref >60.00)
Glucose, Bld: 98 mg/dL (ref 70–99)
Potassium: 3.8 meq/L (ref 3.5–5.1)
Sodium: 139 meq/L (ref 135–145)

## 2023-08-10 LAB — HEPATIC FUNCTION PANEL
ALT: 10 U/L (ref 0–35)
AST: 11 U/L (ref 0–37)
Albumin: 4 g/dL (ref 3.5–5.2)
Alkaline Phosphatase: 41 U/L (ref 39–117)
Bilirubin, Direct: 0.1 mg/dL (ref 0.0–0.3)
Total Bilirubin: 0.3 mg/dL (ref 0.2–1.2)
Total Protein: 6.2 g/dL (ref 6.0–8.3)

## 2023-08-10 LAB — VITAMIN D 25 HYDROXY (VIT D DEFICIENCY, FRACTURES): VITD: 39.65 ng/mL (ref 30.00–100.00)

## 2023-08-10 LAB — LIPID PANEL
Cholesterol: 141 mg/dL (ref 0–200)
HDL: 54.5 mg/dL (ref >39.00)
LDL Cholesterol: 74 mg/dL (ref 0–99)
NonHDL: 86.55
Total CHOL/HDL Ratio: 3
Triglycerides: 64 mg/dL (ref 0.0–149.0)
VLDL: 12.8 mg/dL (ref 0.0–40.0)

## 2023-08-10 LAB — TSH: TSH: 2.2 u[IU]/mL (ref 0.35–5.50)

## 2023-08-10 LAB — HEMOGLOBIN A1C: Hgb A1c MFr Bld: 5.8 % (ref 4.6–6.5)

## 2023-08-17 ENCOUNTER — Ambulatory Visit (INDEPENDENT_AMBULATORY_CARE_PROVIDER_SITE_OTHER): Payer: PRIVATE HEALTH INSURANCE | Admitting: Family Medicine

## 2023-08-17 VITALS — BP 100/64 | HR 75 | Temp 98.9°F | Ht 65.25 in | Wt 209.1 lb

## 2023-08-17 DIAGNOSIS — Z0000 Encounter for general adult medical examination without abnormal findings: Secondary | ICD-10-CM

## 2023-08-17 DIAGNOSIS — G43001 Migraine without aura, not intractable, with status migrainosus: Secondary | ICD-10-CM

## 2023-08-17 DIAGNOSIS — Z1151 Encounter for screening for human papillomavirus (HPV): Secondary | ICD-10-CM

## 2023-08-17 DIAGNOSIS — Z1231 Encounter for screening mammogram for malignant neoplasm of breast: Secondary | ICD-10-CM

## 2023-08-17 MED ORDER — BUSPIRONE HCL 10 MG PO TABS: 10.0000 mg | ORAL_TABLET | Freq: Two times a day (BID) | ORAL | 3 refills | Status: AC

## 2023-08-17 MED ORDER — TOPIRAMATE 100 MG PO TABS: 100.0000 mg | ORAL_TABLET | Freq: Two times a day (BID) | ORAL | 3 refills | Status: AC

## 2023-08-17 NOTE — Patient Instructions (Signed)
 HPV vaccine Pap smear -last Pap in 2020 Mammogram

## 2023-08-17 NOTE — Progress Notes (Signed)
 Cheyenne Mcfate T. Rodrigo Mcgranahan, MD, CAQ Sports Medicine [facility] at [facility] [address] [place], [address]  Phone: [phone]  FAX: [phone]  Cheyenne Rivera - 43 y.o. female  MRN [id number]  Date of Birth: [birth year]  Date: 08/17/2023  PCP: Cheyenne Mirza, MD  Referral: Cheyenne Mirza, MD  Chief Complaint  Patient presents with   Annual Exam   Patient Care Team: Cheyenne Mirza, MD as PCP - General Subjective:   Cheyenne Rivera is a 43 y.o. pleasant patient who presents with the following:  Health Maintenance Summary Reviewed and updated, unless pt declines services.  Tobacco History Reviewed. Non-smoker Alcohol: No concerns, no excessive use Exercise Habits: Some activity, rec at least 30 mins 5 times a week -hit or miss STD concerns: none Drug Use: None Lumps or breast concerns: no  She is generally healthy with the exception of some migraine headaches. She has been having a lot more stress and anxiety, and she has been having at least 1 panic attack a week  HPV Pap smear -last Pap in 2020 -she has seen gynecology in the past Mammogram  VBCI - for cost, medication, mammogram, vaccines Pap  Back and forth between [place] and [place]   Daughter is 31  She is between jobs right now, she is having a lot of financial stress  Health Maintenance  Topic Date Due   Hepatitis B Vaccines (1 of 3 - 19+ 3-dose series) Never done   HPV VACCINES (1 - 3-dose SCDM series) Never done   COVID-19 Vaccine (3 - 2024-25 season) 09/11/2022   INFLUENZA VACCINE  04/09/2024 (Originally 08/11/2023)   Cervical Cancer Screening (HPV/Pap Cotest)  08/16/2024 (Originally 02/28/2023)   DTaP/Tdap/Td (2 - Td or Tdap) 01/13/2026   Hepatitis C Screening  Completed   HIV Screening  Completed   Meningococcal B Vaccine  Aged Out    Immunization History  Administered Date(s) Administered   Influenza-Unspecified 10/11/2014, 10/27/2015, 09/27/2016, 10/17/2017,  10/16/2018, 09/29/2022   PFIZER(Purple Top)SARS-COV-2 Vaccination 02/12/2019, 03/05/2019   Tdap 01/14/2016   Patient Active Problem List   Diagnosis Date Noted   Migraine headache 08/22/2011    Priority: Medium    Vitamin D  deficiency 03/06/2018   IBS (irritable bowel syndrome) 01/15/2016   Severe obesity (BMI >= 40) (HCC) 01/15/2016   Family history of breast cancer 06/09/2010   GAD (generalized anxiety disorder) 05/05/2008   Depression, major, in remission (HCC) 05/05/2008    Past Medical History:  Diagnosis Date   Anxiety    Depression    Family history of breast cancer 06/09/2010   IBS (irritable bowel syndrome) 01/15/2016   Migraine     Past Surgical History:  Procedure Laterality Date   CHOLECYSTECTOMY     LEEP  03/10/2001   TONSILLECTOMY      Family History  Problem Relation Age of Onset   Alcohol abuse Father    Sudden death Other        >50, Grand father, ?cause   Breast cancer Maternal Grandmother        33's ?   Colon cancer Neg Hx    Ovarian cancer Neg Hx    Lung cancer Neg Hx    Prostate cancer Neg Hx    Coronary artery disease Neg Hx    Stroke Neg Hx    Diabetes Neg Hx    Mental illness Neg Hx     Social History   Social History Narrative   Not on file  Past Medical History, Surgical History, Social History, Family History, Problem List, Medications, and Allergies have been reviewed and updated if relevant.  Review of Systems: Pertinent positives are listed above.  Otherwise, a full 14 point review of systems has been done in full and it is negative except where it is noted positive.  Objective:   BP 100/64   Pulse 75   Temp 98.9 F (37.2 C) (Temporal)   Ht 5' 5.25 (1.657 m)   Wt 209 lb 2 oz (94.9 kg)   SpO2 97%   BMI 34.53 kg/m  Ideal Body Weight: Weight in (lb) to have BMI = 25: 151.1 No results found.    08/17/2023    2:48 PM 06/07/2023    9:30 AM 08/04/2022    4:05 PM  Depression screen PHQ 2/9  Decreased Interest 0 1 0   Down, Depressed, Hopeless 1 0 1  PHQ - 2 Score 1 1 1   Altered sleeping 1 1   Tired, decreased energy 0 0   Change in appetite 0 0   Feeling bad or failure about yourself  3 3   Trouble concentrating 0 0   Moving slowly or fidgety/restless 0 0   Suicidal thoughts 0 0   PHQ-9 Score 5 5   Difficult doing work/chores Not difficult at all Not difficult at all      GEN: well developed, well nourished, no acute distress Eyes: conjunctiva and lids normal, PERRLA, EOMI ENT: TM clear, nares clear, oral exam WNL Neck: supple, no lymphadenopathy, no thyromegaly, no JVD Pulm: clear to auscultation and percussion, respiratory effort normal CV: regular rate and rhythm, S1-S2, no murmur, rub or gallop, no bruits Chest: no scars, masses, no lumps BREAST: breast exam declined GI: soft, non-tender; no hepatosplenomegaly, masses; active bowel sounds all quadrants GU: GU exam declined Lymph: no cervical, axillary or inguinal adenopathy MSK: gait normal, muscle tone and strength WNL, no joint swelling, effusions, discoloration, crepitus  SKIN: clear, good turgor, color WNL, no rashes, lesions, or ulcerations Neuro: normal mental status, normal strength, sensation, and motion Psych: alert; oriented to person, place and time, normally interactive and not anxious or depressed in appearance.   All labs reviewed with patient. Results for orders placed or performed in visit on 08/10/23  VITAMIN D  25 Hydroxy (Vit-D Deficiency, Fractures)   Collection Time: 08/10/23  8:07 AM  Result Value Ref Range   VITD 39.65 30.00 - 100.00 ng/mL  TSH   Collection Time: 08/10/23  8:07 AM  Result Value Ref Range   TSH 2.20 0.35 - 5.50 uIU/mL  Lipid panel   Collection Time: 08/10/23  8:07 AM  Result Value Ref Range   Cholesterol 141 0 - 200 mg/dL   Triglycerides 35.9 0.0 - 149.0 mg/dL   HDL 45.49 >60.99 mg/dL   VLDL 87.1 0.0 - 59.9 mg/dL   LDL Cholesterol 74 0 - 99 mg/dL   Total CHOL/HDL Ratio 3    NonHDL 86.55    Hemoglobin A1c   Collection Time: 08/10/23  8:07 AM  Result Value Ref Range   Hgb A1c MFr Bld 5.8 4.6 - 6.5 %  Hepatic function panel   Collection Time: 08/10/23  8:07 AM  Result Value Ref Range   Total Bilirubin 0.3 0.2 - 1.2 mg/dL   Bilirubin, Direct 0.1 0.0 - 0.3 mg/dL   Alkaline Phosphatase 41 39 - 117 U/L   AST 11 0 - 37 U/L   ALT 10 0 - 35 U/L   Total Protein  6.2 6.0 - 8.3 g/dL   Albumin 4.0 3.5 - 5.2 g/dL  CBC with Differential/Platelet   Collection Time: 08/10/23  8:07 AM  Result Value Ref Range   WBC 8.9 4.0 - 10.5 K/uL   RBC 4.74 3.87 - 5.11 Mil/uL   Hemoglobin 14.7 12.0 - 15.0 g/dL   HCT 55.6 63.9 - 53.9 %   MCV 93.5 78.0 - 100.0 fl   MCHC 33.2 30.0 - 36.0 g/dL   RDW 87.2 88.4 - 84.4 %   Platelets 266.0 150.0 - 400.0 K/uL   Neutrophils Relative % 59.1 43.0 - 77.0 %   Lymphocytes Relative 28.6 12.0 - 46.0 %   Monocytes Relative 6.2 3.0 - 12.0 %   Eosinophils Relative 5.3 (H) 0.0 - 5.0 %   Basophils Relative 0.8 0.0 - 3.0 %   Neutro Abs 5.3 1.4 - 7.7 K/uL   Lymphs Abs 2.5 0.7 - 4.0 K/uL   Monocytes Absolute 0.5 0.1 - 1.0 K/uL   Eosinophils Absolute 0.5 0.0 - 0.7 K/uL   Basophils Absolute 0.1 0.0 - 0.1 K/uL  Basic metabolic panel   Collection Time: 08/10/23  8:07 AM  Result Value Ref Range   Sodium 139 135 - 145 mEq/L   Potassium 3.8 3.5 - 5.1 mEq/L   Chloride 109 96 - 112 mEq/L   CO2 24 19 - 32 mEq/L   Glucose, Bld 98 70 - 99 mg/dL   BUN 9 6 - 23 mg/dL   Creatinine, Ser 9.26 0.40 - 1.20 mg/dL   GFR 899.49 >39.99 mL/min   Calcium 8.8 8.4 - 10.5 mg/dL   No results found.  Assessment and Plan:     ICD-10-CM   1. Healthcare maintenance  Z00.00     2. Migraine without aura and with status migrainosus, not intractable  G43.001 AMB Referral VBCI Care Management    3. Screening for HPV (human papillomavirus)  Z11.51 AMB Referral VBCI Care Management    4. Screening mammogram for breast cancer  Z12.31 AMB Referral VBCI Care Management     Anxiety and  panic have worsened.  I am going to increase her BuSpar  to 7.5 mg p.o. twice daily for 3 days, then increase to 10 mg p.o. twice daily.  Migraines are improved, she still having some breakthrough migraine at least twice a week.  We can increase her Topamax .  I am going to asked value-based care initiative to get involved with this case.  Hopefully, there is some assistance that she can receive through the hospital system for health maintenance screening including mammogram and Pap smear.  She may be dense for medications, as well.  Health Maintenance Exam: The patient's preventative maintenance and recommended screening tests for an annual wellness exam were reviewed in full today. Brought up to date unless services declined.  Counselled on the importance of diet, exercise, and its role in overall health and mortality. The patient's FH and SH was reviewed, including their home life, tobacco status, and drug and alcohol status.  Follow-up in 1 year for physical exam or additional follow-up below.  Disposition: No follow-ups on file.  Future Appointments  Date Time Provider Department Center  08/12/2024  7:30 AM [facility]  08/19/2024  4:00 PM Surya Schroeter, Jacques, MD [facility]     Meds ordered this encounter  Medications   topiramate  (TOPAMAX ) 100 MG tablet    Sig: Take 1 tablet (100 mg total) by mouth 2 (two) times daily.    Dispense:  60  tablet    Refill:  3   busPIRone  (BUSPAR ) 10 MG tablet    Sig: Take 1 tablet (10 mg total) by mouth 2 (two) times daily.    Dispense:  60 tablet    Refill:  3   Medications Discontinued During This Encounter  Medication Reason   topiramate  (TOPAMAX ) 50 MG tablet Reorder   busPIRone  (BUSPAR ) 5 MG tablet    Orders Placed This Encounter  Procedures   AMB Referral VBCI Care Management    Signed,  Jacques T. Mikeyla Music, MD   Allergies as of 08/17/2023       Reactions   Codeine Other (See Comments)   Morphine    REACTION: rash  and nausea        Medication List        Accurate as of August 17, 2023  3:37 PM. If you have any questions, ask your nurse or doctor.          ASHWAGANDHA PO Take 2 each by mouth daily.   busPIRone  10 MG tablet Commonly known as: BUSPAR  Take 1 tablet (10 mg total) by mouth 2 (two) times daily. What changed:  medication strength how much to take Changed by: Jacques Manuel Dall   levonorgestrel  20 MCG/DAY Iud Commonly known as: MIRENA  1 each by Intrauterine route once.   multivitamin Liqd Take 5 mLs by mouth daily. Lysle Bash + Hair Growth   PROBIOTIC PO Take 2 each by mouth daily.   promethazine  25 MG tablet Commonly known as: PHENERGAN  Take 1 tablet (25 mg total) by mouth every 6 (six) hours as needed for nausea or vomiting.   rizatriptan  10 MG tablet Commonly known as: MAXALT  Take 1 tablet (10 mg total) by mouth as needed for migraine. May repeat in 2 hours if needed   topiramate  100 MG tablet Commonly known as: Topamax  Take 1 tablet (100 mg total) by mouth 2 (two) times daily. What changed:  medication strength how much to take Changed by: Jacques Kimbely Whiteaker

## 2023-09-07 ENCOUNTER — Telehealth: Payer: Self-pay

## 2023-09-07 NOTE — Progress Notes (Signed)
 Complex Care Management Note  Care Guide Note 09/07/2023 Name: BERTIE MCCONATHY MRN: [id number] DOB: 1980-03-13  Candy JAYSON Dolly is a 44 y.o. year old female who sees Copland, Jacques, MD for primary care. I reached out to Roxane C Peach by phone today to offer complex care management services.  Ms. Bickle was given information about Complex Care Management services today including:   The Complex Care Management services include support from the care team which includes your Nurse Care Manager, Clinical Social Worker, or Pharmacist.  The Complex Care Management team is here to help remove barriers to the health concerns and goals most important to you. Complex Care Management services are voluntary, and the patient may decline or stop services at any time by request to their care team member.   Complex Care Management Consent Status: Patient agreed to services and verbal consent obtained.   Follow up plan:  Telephone appointment with complex care management team member scheduled for:  09/08/23 & 09/21/23.   Encounter Outcome:  Patient Scheduled  Dreama [facility]  [facility] VBCI Assistant Direct Dial: [phone]  Fax: [phone]

## 2023-09-08 ENCOUNTER — Other Ambulatory Visit: Payer: Self-pay

## 2023-09-08 NOTE — Patient Instructions (Signed)
 Visit Information  Thank you for taking time to visit with me today. Please don't hesitate to contact me if I can be of assistance to you before our next scheduled appointment.  Our next appointment is by telephone on 09/29/23 at 10:30 Please call the care guide team at [phone] if you need to cancel or reschedule your appointment.   Following is a copy of your care plan:   Goals Addressed             This Visit's Progress    BSW VBCI Social Work Care Plan       Problems:   Housing   CSW Clinical Goal(s):   Over the next 30 days the Patient will work with housing resources to address needs related to housing.  Interventions:  SW emailed resources for affordable housing in [place]  Patient Goals/Self-Care Activities:  Patient will use resources SW sent to locate permanent housing  Plan:   The care management team will reach out to the patient again over the next 15 days.        Please call the Suicide and Crisis Lifeline: 988 call the [place]  National Suicide Prevention Lifeline: 612-241-3297 or TTY: 570-302-6575 TTY 281-578-8861) to talk to a trained counselor call 1-800-273-TALK (toll free, 24 hour hotline) call 911 if you are experiencing a Mental Health or Behavioral Health Crisis or need someone to talk to.  Patient verbalizes understanding of instructions and care plan provided today and agrees to view in MyChart. Active MyChart status and patient understanding of how to access instructions and care plan via MyChart confirmed with patient.     Thersia Hoar, HEDWIG, MHA [place]  [facility] Social Worker, Population Health [phone]

## 2023-09-08 NOTE — Patient Outreach (Signed)
 Complex Care Management   Visit Note  09/08/2023  Name:  Cheyenne Rivera MRN: [id number] DOB: March 29, 1980  Situation: Referral received for Complex Care Management related to SDOH Barriers:  Housing affordable housing Financial Resource Strain I obtained verbal consent from Patient.  Visit completed with Patient  on the phone  Background:   Past Medical History:  Diagnosis Date   Anxiety    Depression    Family history of breast cancer 06/09/2010   IBS (irritable bowel syndrome) 01/15/2016   Migraine     Assessment:  SW completed a telephone outreach with patient, she states she is homeless. She is currently staying with her sister. She has applied for and is currently receiving foodstamps. She is not working. SW and patient agreed for resources for affordable housing to be emailed to address on file.  SDOH Interventions    Flowsheet Row Patient Outreach Telephone from 09/08/2023 in [facility] Office Visit from 08/17/2023 in [facility] Visit from 06/07/2023 in [facility] [place]  SDOH Interventions     Housing Interventions Community Resources Provided -- --  Depression Interventions/Treatment  -- Counseling Counseling      Recommendation:   No recommendations at this time  Follow Up Plan:   Telephone follow-up 09/29/23 at 10:30  Cheyenne Rivera, BSW, MHA [place]  [facility] Social Worker, Population Health [phone]

## 2023-09-21 ENCOUNTER — Other Ambulatory Visit: Payer: Self-pay

## 2023-09-21 DIAGNOSIS — F32A Depression, unspecified: Secondary | ICD-10-CM

## 2023-09-21 NOTE — Patient Instructions (Addendum)
 Visit Information  Thank you for taking time to visit with me today. Please don't hesitate to contact me if I can be of assistance to you before our next scheduled appointment.  Our next appointment is by telephone on 10/03/2023 at 3:00 pm Please call the care guide team at [phone] if you need to cancel or reschedule your appointment.   Following is a copy of your care plan:   Goals Addressed             This Visit's Progress    [facility] RN Care Plan - Pain/Migraine       Problems:  Chronic Disease Management support and education needs related to Pain/Migraine  Goal: Over the next 30 days the Patient will attend all scheduled medical appointments: patient will attend all appointments as evidenced by chart review        continue to work with RN Care Manager and/or Social Worker to address care management and care coordination needs related to Pain/Migraine as evidenced by adherence to care management team scheduled appointments     take all medications exactly as prescribed and will call provider for medication related questions as evidenced by patient reporting compliance    verbalize basic understanding of Pain/Migraine disease process and self health management plan as evidenced by taking meds as ordered, identifying triggers for headaches/migraines,   Interventions:   Pain Interventions: Pain assessment performed Medications reviewed Discussed importance of adherence to all scheduled medical appointments Counseled on the importance of reporting any/all new or changed pain symptoms or management strategies to pain management provider Discussed use of relaxation techniques and/or diversional activities to assist with pain reduction (distraction, imagery, relaxation, massage, acupressure, TENS, heat, and cold application Screening for signs and symptoms of depression related to chronic disease state  Assessed social determinant of health barriers  Patient Self-Care Activities:   Attend all scheduled provider appointments Call pharmacy for medication refills 3-7 days in advance of running out of medications Call provider office for new concerns or questions  Take medications as prescribed   Work with the social worker to address care coordination needs and will continue to work with the clinical team to address health care and disease management related needs  Plan:  Telephone follow up appointment with care management team member scheduled for:  10/03/2023 at 3:00 pm             Please call the Suicide and Crisis Lifeline: 988 call the [place]  National Suicide Prevention Lifeline: (909) 657-2113 or TTY: (442) 649-8465 TTY 458-186-8034) to talk to a trained counselor call 1-800-273-TALK (toll free, 24 hour hotline) go to [facility] [address], [place] [phone]) call 911 if you are experiencing a Mental Health or Behavioral Health Crisis or need someone to talk to.  Patient verbalizes understanding of instructions and care plan provided today and agrees to view in MyChart. Active MyChart status and patient understanding of how to access instructions and care plan via MyChart confirmed with patient.     Nestora Duos, MSN, RN [place]  [facility], [facility] RN Care Manager Direct Dial: [phone] Fax: [phone]  Managing Depression, Adult Depression is a mental health condition that affects your thoughts, feelings, and actions. Being diagnosed with depression can bring you relief if you did not know why you have felt or behaved a certain way. It could also leave you feeling overwhelmed. Finding ways to manage your symptoms can help you feel more positive about your future. How to  manage lifestyle changes Being depressed is difficult. Depression can increase the level of everyday stress. Stress can make depression symptoms worse. You may believe your symptoms cannot be managed or  will never improve. However, there are many things you can try to help manage your symptoms. There is hope. Managing stress  Stress is your body's reaction to life changes and events, both good and bad. Stress can add to your feelings of depression. Learning to manage your stress can help lessen your feelings of depression. Try some of the following approaches to reducing your stress (stress reduction techniques): Listen to music that you enjoy and that inspires you. Try using a meditation app or take a meditation class. Develop a practice that helps you connect with your spiritual self. Walk in nature, pray, or go to a place of worship. Practice deep breathing. To do this, inhale slowly through your nose. Pause at the top of your inhale for a few seconds and then exhale slowly, letting yourself relax. Repeat this three or four times. Practice yoga to help relax and work your muscles. Choose a stress reduction technique that works for you. These techniques take time and practice to develop. Set aside 5-15 minutes a day to do them. Therapists can offer training in these techniques. Do these things to help manage stress: Keep a journal. Know your limits. Set healthy boundaries for yourself and others, such as saying no when you think something is too much. Pay attention to how you react to certain situations. You may not be able to control everything, but you can change your reaction. Add humor to your life by watching funny movies or shows. Make time for activities that you enjoy and that relax you. Spend less time using electronics, especially at night before bed. The light from screens can make your brain think it is time to get up rather than go to bed.  Medicines Medicines, such as antidepressants, are often a part of treatment for depression. Talk with your pharmacist or health care provider about all the medicines, supplements, and herbal products that you take, their possible side  effects, and what medicines and other products are safe to take together. Make sure to report any side effects you may have to your health care provider. Relationships Your health care provider may suggest family therapy, couples therapy, or individual therapy as part of your treatment. How to recognize changes Everyone responds differently to treatment for depression. As you recover from depression, you may start to: Have more interest in doing activities. Feel more hopeful. Have more energy. Eat a more regular amount of food. Have better mental focus. It is important to recognize if your depression is not getting better or is getting worse. The symptoms you had in the beginning may return, such as: Feeling tired. Eating too much or too little. Sleeping too much or too little. Feeling restless, agitated, or hopeless. Trouble focusing or making decisions. Having unexplained aches and pains. Feeling irritable, angry, or aggressive. If you or your family members notice these symptoms coming back, let your health care provider know right away. Follow these instructions at home: Activity Try to get some form of exercise each day, such as walking. Try yoga, mindfulness, or other stress reduction techniques. Participate in group activities if you are able. Lifestyle Get enough sleep. Cut down on or stop using caffeine , tobacco, alcohol, and any other harmful substances. Eat a healthy diet that includes plenty of vegetables, fruits, whole grains, low-fat dairy products, and lean  protein. Limit foods that are high in solid fats, added sugar, or salt (sodium). General instructions Take over-the-counter and prescription medicines only as told by your health care provider. Keep all follow-up visits. It is important for your health care provider to check on your mood, behavior, and medicines. Your health care provider may need to make changes to your treatment. Where to find support Talking to  others  Friends and family members can be sources of support and guidance. Talk to trusted friends or family members about your condition. Explain your symptoms and let them know that you are working with a health care provider to treat your depression. Tell friends and family how they can help. Finances Find mental health providers that fit with your financial situation. Talk with your health care provider if you are worried about access to food, housing, or medicine. Call your insurance company to learn about your co-pays and prescription plan. Where to find more information You can find support in your area from: Anxiety and Depression Association of America (ADAA): adaa.org Mental Health America: mentalhealthamerica.net The First American on Mental Illness: nami.org Contact a health care provider if: You stop taking your antidepressant medicines, and you have any of these symptoms: Nausea. Headache. Light-headedness. Chills and body aches. Not being able to sleep (insomnia). You or your friends and family think your depression is getting worse. Get help right away if: You have thoughts of hurting yourself or others. Get help right away if you feel like you may hurt yourself or others, or have thoughts about taking your own life. Go to your nearest emergency room or: Call 911. Call the National Suicide Prevention Lifeline at (330)724-4199 or 988. This is open 24 hours a day. Text the Crisis Text Line at (660)800-0632. This information is not intended to replace advice given to you by your health care provider. Make sure you discuss any questions you have with your health care provider. Document Revised: 05/04/2021 Document Reviewed: 05/04/2021 Elsevier Patient Education  2024 Elsevier Inc.  Managing Anxiety, Adult After being diagnosed with anxiety, you may be relieved to know why you have felt or behaved a certain way. You may also feel overwhelmed about the treatment ahead and what it  will mean for your life. With care and support, you can manage your anxiety. How to manage lifestyle changes Understanding the difference between stress and anxiety Although stress can play a role in anxiety, it is not the same as anxiety. Stress is your body's reaction to life changes and events, both good and bad. Stress is often caused by something external, such as a deadline, test, or competition. It normally goes away after the event has ended and will last just a few hours. But, stress can be ongoing and can lead to more than just stress. Anxiety is caused by something internal, such as imagining a terrible outcome or worrying that something will go wrong that will greatly upset you. Anxiety often does not go away even after the event is over, and it can become a long-term (chronic) worry. Lowering stress and anxiety Talk with your health care provider or a counselor to learn more about lowering anxiety and stress. They may suggest tension-reduction techniques, such as: Music. Spend time creating or listening to music that you enjoy and that inspires you. Mindfulness-based meditation. Practice being aware of your normal breaths while not trying to control your breathing. It can be done while sitting or walking. Centering prayer. Focus on a word, phrase, or sacred image  that means something to you and brings you peace. Deep breathing. Expand your stomach and inhale slowly through your nose. Hold your breath for 3-5 seconds. Then breathe out slowly, letting your stomach muscles relax. Self-talk. Learn to notice and spot thought patterns that lead to anxiety reactions. Change those patterns to thoughts that feel peaceful. Muscle relaxation. Take time to tense muscles and then relax them. Choose a tension-reduction technique that fits your lifestyle and personality. These techniques take time and practice. Set aside 5-15 minutes a day to do them. Specialized therapists can offer counseling and  training in these techniques. The training to help with anxiety may be covered by some insurance plans. Other things you can do to manage stress and anxiety include: Keeping a stress diary. This can help you learn what triggers your reaction and then learn ways to manage your response. Thinking about how you react to certain situations. You may not be able to control everything, but you can control your response. Making time for activities that help you relax and not feeling guilty about spending your time in this way. Doing visual imagery. This involves imagining or creating mental pictures to help you relax. Practicing yoga. Through yoga poses, you can lower tension and relax.  Medicines Medicines for anxiety include: Antidepressant medicines. These are usually prescribed for long-term daily control. Anti-anxiety medicines. These may be added in severe cases, especially when panic attacks occur. When used together, medicines, psychotherapy, and tension-reduction techniques may be the most effective treatment. Relationships Relationships can play a big part in helping you recover. Spend more time connecting with trusted friends and family members. Think about going to couples counseling if you have a partner, taking family education classes, or going to family therapy. Therapy can help you and others better understand your anxiety. How to recognize changes in your anxiety Everyone responds differently to treatment for anxiety. Recovery from anxiety happens when symptoms lessen and stop interfering with your daily life at home or work. This may mean that you will start to: Have better concentration and focus. Worry will interfere less in your daily thinking. Sleep better. Be less irritable. Have more energy. Have improved memory. Try to recognize when your condition is getting worse. Contact your provider if your symptoms interfere with home or work and you feel like your condition is not  improving. Follow these instructions at home: Activity Exercise. Adults should: Exercise for at least 150 minutes each week. The exercise should increase your heart rate and make you sweat (moderate-intensity exercise). Do strengthening exercises at least twice a week. Get the right amount and quality of sleep. Most adults need 7-9 hours of sleep each night. Lifestyle  Eat a healthy diet that includes plenty of vegetables, fruits, whole grains, low-fat dairy products, and lean protein. Do not eat a lot of foods that are high in fats, added sugars, or salt (sodium). Make choices that simplify your life. Do not use any products that contain nicotine or tobacco. These products include cigarettes, chewing tobacco, and vaping devices, such as e-cigarettes. If you need help quitting, ask your provider. Avoid caffeine , alcohol, and certain over-the-counter cold medicines. These may make you feel worse. Ask your pharmacist which medicines to avoid. General instructions Take over-the-counter and prescription medicines only as told by your provider. Keep all follow-up visits. This is to make sure you are managing your anxiety well or if you need more support. Where to find support You can get help and support from: Self-help groups. Online  and [employer]. A trusted spiritual leader. Couples counseling. Family education classes. Family therapy. Where to find more information You may find that joining a support group helps you deal with your anxiety. The following sources can help you find counselors or support groups near you: Mental Health America: mentalhealthamerica.net Anxiety and Depression Association of [place] (ADAA): adaa.org The First American on Mental Illness (NAMI): nami.org Contact a health care provider if: You have a hard time staying focused or finishing tasks. You spend many hours a day feeling worried about everyday life. You are very tired because you cannot stop  worrying. You start to have headaches or often feel tense. You have chronic nausea or diarrhea. Get help right away if: Your heart feels like it is racing. You have shortness of breath. You have thoughts of hurting yourself or others. Get help right away if you feel like you may hurt yourself or others, or have thoughts about taking your own life. Go to your nearest emergency room or: Call 911. Call the National Suicide Prevention Lifeline at 224-104-0463 or 988. This is open 24 hours a day. Text the Crisis Text Line at (925)617-1420. This information is not intended to replace advice given to you by your health care provider. Make sure you discuss any questions you have with your health care provider. Document Revised: 10/05/2021 Document Reviewed: 04/19/2020 Elsevier Patient Education  2024 Elsevier Inc.  Migraine Headache A migraine headache is an intense pulsing or throbbing pain on one or both sides of the head. Migraine headaches may also cause other symptoms, such as nausea, vomiting, and sensitivity to light and noise. A migraine headache can last from 4 hours to 3 days. Talk with your health care provider about what things may bring on (trigger) your migraine headaches. What are the causes? The exact cause is not known. However, a migraine may be caused when nerves in the brain get irritated and release chemicals that cause blood vessels to become inflamed. This inflammation causes pain. Migraines may be triggered or caused by: Smoking. Medicines, such as: Nitroglycerin, which is used to treat chest pain. Birth control pills. Estrogen. Certain blood pressure medicines. Foods or drinks that contain nitrates, glutamate, aspartame, MSG, or tyramine. Certain foods or drinks, such as aged cheeses, chocolate, alcohol, or caffeine . Doing physical activity that is very hard. Other triggers may include: Menstruation. Pregnancy. Hunger. Stress. Getting too much or too little  sleep. Weather changes. Tiredness (fatigue). What increases the risk? The following factors may make you more likely to have migraine headaches: Being between the ages of 40-76 years old. Being female. Having a family history of migraine headaches. Being Caucasian. Having a mental health condition, such as depression or anxiety. Being obese. What are the signs or symptoms? The main symptom of this condition is pulsing or throbbing pain. This pain may: Happen in any area of the head, such as on one or both sides. Make it hard to do daily activities. Get worse with physical activity. Get worse around bright lights, loud noises, or smells. Other symptoms may include: Nausea. Vomiting. Dizziness. Before a migraine headache starts, you may get warning signs (an aura). An aura may include: Seeing flashing lights or having blind spots. Seeing bright spots, halos, or zigzag lines. Having tunnel vision or blurred vision. Having numbness or a tingling feeling. Having trouble talking. Having muscle weakness. After a migraine ends, you may have symptoms. These may include: Feeling tired. Trouble concentrating. How is this diagnosed? A migraine headache can be diagnosed  based on: Your symptoms. A physical exam. Tests, such as: A CT scan or an MRI of the head. These tests can help rule out other causes of headaches. Taking fluid from the spine (lumbar puncture) to examine it (cerebrospinal fluid analysis, or CSF analysis). How is this treated? This condition may be treated with medicines that: Relieve pain and nausea. Prevent migraines. Treatment may also include: Acupuncture. Lifestyle changes like avoiding foods that trigger migraine headaches. Learning ways to control your body (biofeedback). Talk therapy to help you know and deal with negative thoughts (cognitive behavioral therapy). Follow these instructions at home: Medicines Take over-the-counter and prescription medicines  only as told by your provider. Ask your provider if the medicine prescribed to you: Requires you to avoid driving or using machinery. Can cause constipation. You may need to take these actions to prevent or treat constipation: Drink enough fluid to keep your pee (urine) pale yellow. Take over-the-counter or prescription medicines. Eat foods that are high in fiber, such as beans, whole grains, and fresh fruits and vegetables. Limit foods that are high in fat and processed sugars, such as fried or sweet foods. Lifestyle  Do not drink alcohol. Do not use any products that contain nicotine or tobacco. These products include cigarettes, chewing tobacco, and vaping devices, such as e-cigarettes. If you need help quitting, ask your provider. Get 7-9 hours of sleep each night, or the amount recommended by your provider. Find ways to manage stress, such as meditation, deep breathing, or yoga. Try to exercise regularly. This can help lessen how bad and how often your migraines occur. General instructions Keep a journal to find out what triggers your migraines, so you can avoid those things. For example, write down: What you eat and drink. How much sleep you get. Any change to your diet or medicines. If you have a migraine headache: Avoid things that make your symptoms worse, such as bright lights. Lie down in a dark, quiet room. Do not drive or use machinery. Ask your provider what activities are safe for you while you have symptoms. Keep all follow-up visits. Your provider will monitor your symptoms and recommend any further treatment. Where to find more information Coalition for Headache and Migraine Patients (CHAMP): headachemigraine.org American Migraine Foundation: americanmigrainefoundation.org National Headache Foundation: headaches.org Contact a health care provider if: You have symptoms that are different or worse than your usual migraine headache symptoms. You have more than 15 days  of headaches in one month. Get help right away if: Your migraine headache becomes severe or lasts more than 72 hours. You have a fever or stiff neck. You have vision loss. Your muscles feel weak or like you cannot control them. You lose your balance often or have trouble walking. You faint. You have a seizure. This information is not intended to replace advice given to you by your health care provider. Make sure you discuss any questions you have with your health care provider. Document Revised: 08/23/2021 Document Reviewed: 08/23/2021 Elsevier Patient Education  2024 [employer].  Advance Directive  Advance directives are legal papers that state your wishes about health care decisions. They let your wishes be known to family, friends, and health care providers if you become unable to speak for yourself.  You should write these papers out over time rather than all at once. They can be changed and updated at any time. The types of advance directives include: Medical power of attorney (POA). Living wills. Do not resuscitate (DNR) or do not attempt  resuscitation (DNAR) orders. What are a health care proxy and medical POA? A health care proxy is also called a health care agent. It's a person you choose to make medical decisions for you when you can't make them for yourself. In most cases, a proxy is a trusted friend or family member. A medical POA is legal paperwork that names your proxy. It may need to be: Signed. Notarized. Dated. Copied. Witnessed. Added to your medical record. You may also want to choose someone to handle your money if you can't do so. This is called a durable POA for finances. It's separate from a medical POA. You may choose your health care proxy or someone else to act as your agent in money matters. If you don't have a proxy, or if the proxy may not be acting in your best interest, a court may choose a guardian to act on your behalf. What is a living will? A  living will is legal paperwork that states your wishes about medical care. Providers should keep a copy of it in your medical record. You may want to give a copy to family members or friends. You can also keep a card in your wallet to let loved ones know you have a living will and where they can find it. A living will may be used if: You're very sick with something that will end your life. You become disabled. You can't make decisions or speak for yourself. Your living will should include whether: To use or not use life support equipment. This may include machines to filter your blood or to help you breathe. You want a DNR or DNAR order. This tells providers not to use CPR if your heart or breathing stops. To use or not use tube feeding. You want to be given foods and fluids. You want a type of comfort care called palliative care. This may be given when the goal for treatment becomes comfort rather than a cure. You want to donate your organs and tissues. A living will doesn't say what to do with your money and property if you pass away. What is a DNR or DNAR? A DNR or DNAR order is a request not to have CPR. If you don't have one of these orders, a provider will try to help you if your heart stops or you stop breathing.  If you plan to have surgery, talk with your provider about your DNR or DNAR order. What happens if I don't have an advance directive? Each state has its own laws about advance directives. Some states assign family decision makers to act on your behalf if you don't have an advance directive.  Check with your provider, attorney, or state representative about the laws in your state. Where to find more information Each state has its own laws about advance directives. You can look up these laws at: https://rodriguez-phillips.com/ This information is not intended to replace advice given to you by your health care provider. Make sure you discuss any questions you have with your health care  provider. Document Revised: 05/16/2022 Document Reviewed: 05/16/2022 Elsevier Patient Education  2024 [employer].

## 2023-09-21 NOTE — Patient Outreach (Signed)
 Complex Care Management   Visit Note  09/21/2023  Name:  Cheyenne Rivera MRN: [id number] DOB: 03/07/1980  Situation: Referral received for Complex Care Management related to Pain/Migraine I obtained verbal consent from Patient.  Visit completed with Patient  on the phone  Background:   Past Medical History:  Diagnosis Date   Anxiety    Depression    Family history of breast cancer 06/09/2010   IBS (irritable bowel syndrome) 01/15/2016   Migraine     Assessment: Patient Reported Symptoms:  Cognitive Cognitive Status: Alert and oriented to person, place, and time, Difficulties with attention and concentration, Insightful and able to interpret abstract concepts, Normal speech and language skills Cognitive/Intellectual Conditions Management [RPT]: None reported or documented in medical history or problem list   Health Maintenance Behaviors: Annual physical exam Healing Pattern: Average  Neurological Neurological Review of Symptoms: Headaches Neurological Comment: chronic migraines, improved with increased topamax , was having cluster headaches, better  HEENT HEENT Symptoms Reported: No symptoms reported HEENT Comment: vision changes after mounjaro - will not see eye doc at this time    Cardiovascular Cardiovascular Symptoms Reported: No symptoms reported Does patient have uncontrolled Hypertension?: No Cardiovascular Management Strategies: Routine screening Cardiovascular Comment: concern - sister has lymphedema, has had swelling in legs and feet in past  Respiratory Respiratory Symptoms Reported: Shortness of breath Other Respiratory Symptoms: asthma and long COVID, DOE at times, inhaler prn Respiratory Management Strategies: Routine screening, Medication therapy  Endocrine Endocrine Symptoms Reported: Not assessed Is patient diabetic?: No    Gastrointestinal Gastrointestinal Symptoms Reported: Abdominal pain or discomfort, Constipation Additional Gastrointestinal Details: IBSD -  manages with diet,  Reflux  - diet - states will eventulay need GI appointment, hx chole Gastrointestinal Management Strategies: Diet modification    Genitourinary Genitourinary Symptoms Reported: No symptoms reported Additional Genitourinary Details: hx UTI sx - usually managed with OTC, IUD - 10/2017 implant, due soon for removal and needs PAP, starting to get periods, advised to make appointment for gyn, discussed clinic options - declined, mammogram needed - last approximately 5 years ago - considering applying for Medicaid - unsure but cannot afford at this time, interested in clinic/mobile screening    Integumentary Integumentary Symptoms Reported: No symptoms reported    Musculoskeletal Musculoskelatal Symptoms Reviewed: No symptoms reported   Falls in the past year?: No Number of falls in past year: 1 or less Was there an injury with Fall?: No Fall Risk Category Calculator: 0 Patient Fall Risk Level: Low Fall Risk Patient at Risk for Falls Due to: No Fall Risks  Psychosocial Psychosocial Symptoms Reported: Anxiety - if selected complete GAD, Depression - if selected complete PHQ 2-9 Additional Psychological Details: LCSW 10/09/23 panic attacks   Major Change/Loss/Stressor/Fears (CP): Environment, Medical condition, self, School or job, Resources Quality of Family Relationships: supportive, helpful    09/21/2023    PHQ2-9 Depression Screening   Little interest or pleasure in doing things Several days  Feeling down, depressed, or hopeless Several days  PHQ-2 - Total Score 2  Trouble falling or staying asleep, or sleeping too much Several days  Feeling tired or having little energy Several days  Poor appetite or overeating  Several days  Feeling bad about yourself - or that you are a failure or have let yourself or your family down Nearly every day  Trouble concentrating on things, such as reading the newspaper or watching television Several days  Moving or speaking so slowly that  other people could have noticed.  Or  the opposite - being so fidgety or restless that you have been moving around a lot more than usual Not at all  Thoughts that you would be better off dead, or hurting yourself in some way Not at all  PHQ2-9 Total Score 9  If you checked off any problems, how difficult have these problems made it for you to do your work, take care of things at home, or get along with other people Somewhat difficult  Depression Interventions/Treatment Counseling    There were no vitals filed for this visit.  Medications Reviewed Today     Reviewed by Devra Lands, RN (Registered Nurse) on 09/21/23 at 1323  Med List Status: <None>   Medication Order Taking? Sig Documenting Provider Last Dose Status Informant  ASHWAGANDHA PO [id number] Yes Take 2 each by mouth daily. [provider]  Active   busPIRone  (BUSPAR ) 10 MG tablet [id number] Yes Take 1 tablet (10 mg total) by mouth 2 (two) times daily. Copland, Jacques, MD  Active   levonorgestrel  (MIRENA ) 20 MCG/DAY IUD [id number] Yes 1 each by Intrauterine route once. [provider]  Active   Multiple Vitamin (MULTIVITAMIN) LIQD [id number] Yes Take 5 mLs by mouth daily. Lysle Bash + Hair Growth [provider]  Active   Probiotic Product (PROBIOTIC PO) [id number] Yes Take 2 each by mouth daily. [provider]  Active   promethazine  (PHENERGAN ) 25 MG tablet [id number] Yes Take 1 tablet (25 mg total) by mouth every 6 (six) hours as needed for nausea or vomiting. Copland, Jacques, MD  Active   rizatriptan  (MAXALT ) 10 MG tablet [id number] Yes Take 1 tablet (10 mg total) by mouth as needed for migraine. May repeat in 2 hours if needed Copland, Jacques, MD  Active   topiramate  (TOPAMAX ) 100 MG tablet [id number] Yes Take 1 tablet (100 mg total) by mouth 2 (two) times daily. Watt Jacques, MD  Active             Recommendation:   PCP Follow-up Continue Current Plan of Care GYN appointment BSW  09/29/23 LCSW 10/09/23   Follow Up Plan:   Telephone follow-up 2 Denis Carreon  Lands Devra, MSN, RN [facility]  [facility], [facility] RN Care Manager Direct Dial: [phone] Fax: [phone]

## 2023-09-29 ENCOUNTER — Other Ambulatory Visit: Payer: Self-pay

## 2023-09-29 NOTE — Patient Instructions (Signed)
 Visit Information  Thank you for taking time to visit with me today. Please don't hesitate to contact me if I can be of assistance to you before our next scheduled appointment.  Your next care management appointment is by telephone on 10/13/23 at 10am   Please call the care guide team at [phone] if you need to cancel, schedule, or reschedule an appointment.   Please call the Suicide and Crisis Lifeline: 988 call the [place]  National Suicide Prevention Lifeline: 317-737-7614 or TTY: 670-793-6359 TTY 812-022-5895) to talk to a trained counselor call 1-800-273-TALK (toll free, 24 hour hotline) call 911 if you are experiencing a Mental Health or Behavioral Health Crisis or need someone to talk to.  Thersia Hoar, HEDWIG, MHA [place]  [facility] Social Worker, Population Health [phone]

## 2023-09-29 NOTE — Patient Outreach (Signed)
 Complex Care Management   Visit Note  09/29/2023  Name:  Cheyenne Rivera MRN: [id number] DOB: 30-Jun-1980  Situation: Referral received for Complex Care Management related to SDOH Barriers:  Housing affordable housing Financial Resource Strain I obtained verbal consent from Patient.  Visit completed with Patient  on the phone  Background:   Past Medical History:  Diagnosis Date   Anxiety    Depression    Family history of breast cancer 06/09/2010   IBS (irritable bowel syndrome) 01/15/2016   Migraine     Assessment: SW completed a telephone outreach with patient, she did not receieve the resources SW sent via email. SW and patient agreed for resources to be resent to email. Patient and SW discussed applying for Medicaid and the marketplace. SW also sent patient the [employer] application.  SDOH Interventions    Flowsheet Row Patient Outreach Telephone from 09/21/2023 in [place] POPULATION HEALTH DEPARTMENT Patient Outreach Telephone from 09/08/2023 in [place] POPULATION HEALTH DEPARTMENT Office Visit from 08/17/2023 in [facility] Office Visit from 06/07/2023 in [facility] [place]  SDOH Interventions      Food Insecurity Interventions Intervention Not Indicated  [food stamps now - need met] -- -- --  Housing Interventions AMB Referral, Community Resources Provided [employer] Provided -- --  Utilities Interventions Intervention Not Indicated -- -- --  Alcohol Usage Interventions Intervention Not Indicated (Score <7) -- -- --  Depression Interventions/Treatment  Counseling -- Counseling Counseling  Financial Strain Interventions Other (Comment)  [BSW] -- -- --  Physical Activity Interventions Intervention Not Indicated -- -- --  Stress Interventions Other (Comment)  [LCSW referral] -- -- --  Social Connections Interventions Intervention Not Indicated -- -- --      Recommendation:   No  recommendations at this time  Follow Up Plan:   Telephone follow-up 10/13/23 at 10:0  Thersia Hoar, BSW, MHA [place]  [facility] Social Worker, Population Health [phone]

## 2023-10-03 ENCOUNTER — Other Ambulatory Visit: Payer: Self-pay

## 2023-10-03 NOTE — Patient Instructions (Signed)
 Visit Information  Thank you for taking time to visit with me today. Please don't hesitate to contact me if I can be of assistance to you before our next scheduled appointment.  Your next care management appointment is by telephone on 11/01/2023 at 3:00 pm  Telephone follow-up in 1 month  Please call the care guide team at [phone] if you need to cancel, schedule, or reschedule an appointment.   Please call the Suicide and Crisis Lifeline: 988 call the [place]  National Suicide Prevention Lifeline: 9722445350 or TTY: 450-531-5342 TTY 724-206-9641) to talk to a trained counselor call 1-800-273-TALK (toll free, 24 hour hotline) go to [facility] [address], [place] [phone]) call 911 if you are experiencing a Mental Health or Behavioral Health Crisis or need someone to talk to.  Nestora Duos, MSN, RN [place]  [facility], [facility] RN Care Manager Direct Dial: [phone] Fax: [phone]  Managing Depression, Adult Depression is a mental health condition that affects your thoughts, feelings, and actions. Being diagnosed with depression can bring you relief if you did not know why you have felt or behaved a certain way. It could also leave you feeling overwhelmed. Finding ways to manage your symptoms can help you feel more positive about your future. How to manage lifestyle changes Being depressed is difficult. Depression can increase the level of everyday stress. Stress can make depression symptoms worse. You may believe your symptoms cannot be managed or will never improve. However, there are many things you can try to help manage your symptoms. There is hope. Managing stress  Stress is your body's reaction to life changes and events, both good and bad. Stress can add to your feelings of depression. Learning to manage your stress can help lessen your feelings of depression. Try some of the following  approaches to reducing your stress (stress reduction techniques): Listen to music that you enjoy and that inspires you. Try using a meditation app or take a meditation class. Develop a practice that helps you connect with your spiritual self. Walk in nature, pray, or go to a place of worship. Practice deep breathing. To do this, inhale slowly through your nose. Pause at the top of your inhale for a few seconds and then exhale slowly, letting yourself relax. Repeat this three or four times. Practice yoga to help relax and work your muscles. Choose a stress reduction technique that works for you. These techniques take time and practice to develop. Set aside 5-15 minutes a day to do them. Therapists can offer training in these techniques. Do these things to help manage stress: Keep a journal. Know your limits. Set healthy boundaries for yourself and others, such as saying no when you think something is too much. Pay attention to how you react to certain situations. You may not be able to control everything, but you can change your reaction. Add humor to your life by watching funny movies or shows. Make time for activities that you enjoy and that relax you. Spend less time using electronics, especially at night before bed. The light from screens can make your brain think it is time to get up rather than go to bed.  Medicines Medicines, such as antidepressants, are often a part of treatment for depression. Talk with your pharmacist or health care provider about all the medicines, supplements, and herbal products that you take, their possible side effects, and what medicines and other products are safe to take together. Make sure to report  any side effects you may have to your health care provider. Relationships Your health care provider may suggest family therapy, couples therapy, or individual therapy as part of your treatment. How to recognize changes Everyone responds differently to treatment  for depression. As you recover from depression, you may start to: Have more interest in doing activities. Feel more hopeful. Have more energy. Eat a more regular amount of food. Have better mental focus. It is important to recognize if your depression is not getting better or is getting worse. The symptoms you had in the beginning may return, such as: Feeling tired. Eating too much or too little. Sleeping too much or too little. Feeling restless, agitated, or hopeless. Trouble focusing or making decisions. Having unexplained aches and pains. Feeling irritable, angry, or aggressive. If you or your family members notice these symptoms coming back, let your health care provider know right away. Follow these instructions at home: Activity Try to get some form of exercise each day, such as walking. Try yoga, mindfulness, or other stress reduction techniques. Participate in group activities if you are able. Lifestyle Get enough sleep. Cut down on or stop using caffeine , tobacco, alcohol, and any other harmful substances. Eat a healthy diet that includes plenty of vegetables, fruits, whole grains, low-fat dairy products, and lean protein. Limit foods that are high in solid fats, added sugar, or salt (sodium). General instructions Take over-the-counter and prescription medicines only as told by your health care provider. Keep all follow-up visits. It is important for your health care provider to check on your mood, behavior, and medicines. Your health care provider may need to make changes to your treatment. Where to find support Talking to others  Friends and family members can be sources of support and guidance. Talk to trusted friends or family members about your condition. Explain your symptoms and let them know that you are working with a health care provider to treat your depression. Tell friends and family how they can help. Finances Find mental health providers that fit with your  financial situation. Talk with your health care provider if you are worried about access to food, housing, or medicine. Call your insurance company to learn about your co-pays and prescription plan. Where to find more information You can find support in your area from: Anxiety and Depression Association of America (ADAA): adaa.org Mental Health America: mentalhealthamerica.net The [employer] on Mental Illness: nami.org Contact a health care provider if: You stop taking your antidepressant medicines, and you have any of these symptoms: Nausea. Headache. Light-headedness. Chills and body aches. Not being able to sleep (insomnia). You or your friends and family think your depression is getting worse. Get help right away if: You have thoughts of hurting yourself or others. Get help right away if you feel like you may hurt yourself or others, or have thoughts about taking your own life. Go to your nearest emergency room or: Call 911. Call the National Suicide Prevention Lifeline at 669-037-6758 or 988. This is open 24 hours a day. Text the Crisis Text Line at 228-463-6759. This information is not intended to replace advice given to you by your health care provider. Make sure you discuss any questions you have with your health care provider. Document Revised: 05/04/2021 Document Reviewed: 05/04/2021 Elsevier Patient Education  2024 [employer].

## 2023-10-03 NOTE — Patient Outreach (Signed)
 Complex Care Management   Visit Note  10/03/2023  Name:  Cheyenne Rivera MRN: [id number] DOB: May 04, 1980  Situation: Referral received for Complex Care Management related to Migraine, Depression I obtained verbal consent from Patient.  Visit completed with Patient  on the phone  Background:   Past Medical History:  Diagnosis Date   Anxiety    Depression    Family history of breast cancer 06/09/2010   IBS (irritable bowel syndrome) 01/15/2016   Migraine     Assessment: Patient Reported Symptoms:  Cognitive Cognitive Status: No symptoms reported Cognitive/Intellectual Conditions Management [RPT]: None reported or documented in medical history or problem list      Neurological Neurological Review of Symptoms: Headaches    HEENT HEENT Symptoms Reported: Not assessed      Cardiovascular Cardiovascular Symptoms Reported: Not assessed    Respiratory      Endocrine Endocrine Symptoms Reported: Not assessed    Gastrointestinal Gastrointestinal Symptoms Reported: Not assessed      Genitourinary Genitourinary Symptoms Reported: Not assessed    Integumentary Integumentary Symptoms Reported: Not assessed    Musculoskeletal Musculoskelatal Symptoms Reviewed: Not assessed   Falls in the past year?: No Number of falls in past year: 1 or less Was there an injury with Fall?: No Fall Risk Category Calculator: 0 Patient Fall Risk Level: Low Fall Risk Patient at Risk for Falls Due to: No Fall Risks  Psychosocial Psychosocial Symptoms Reported: Alteration in sleep habits, Anxiety - if selected complete GAD, Depression - if selected complete PHQ 2-9, Intrusive thoughts or memories, Irritability Additional Psychological Details: very depressed, sleeping a lot, stopped taking meds, resports relationship concerns with SO, family member ill, overwhelming, no thoughts of harming self, discussed options if this changes, LCSW appointemtn 9/29, declies need for sooner eval, declined appontment with  PCP to discuss since still unsinsured, made aware UC and community clinics available prn, declined info at this time, agreed to restart buspirone , and has been unable to do anything discussed with BSW, made aware can meet in office if needed, states reduced intake, discussed diversional activites, will try walking as distraction Behavioral Management Strategies: Medication therapy, Activity Behavioral Health Comment: denies alcohol/drugs - messages to [facility] and BSW re current depressive episode Major Change/Loss/Stressor/Fears (CP): Environment, Medical condition, self, School or job, Resources, Astronomer or divorce Techniques to [employer] with Loss/Stress/Change: Diversional activities      10/03/2023    PHQ2-9 Depression Screening   Little interest or pleasure in doing things Nearly every day  Feeling down, depressed, or hopeless Nearly every day  PHQ-2 - Total Score 6  Trouble falling or staying asleep, or sleeping too much Nearly every day  Feeling tired or having little energy Nearly every day  Poor appetite or overeating  Nearly every day  Feeling bad about yourself - or that you are a failure or have let yourself or your family down Nearly every day  Trouble concentrating on things, such as reading the newspaper or watching television Several days  Moving or speaking so slowly that other people could have noticed.  Or the opposite - being so fidgety or restless that you have been moving around a lot more than usual Not at all  Thoughts that you would be better off dead, or hurting yourself in some way Not at all  PHQ2-9 Total Score 19  If you checked off any problems, how difficult have these problems made it for you to do your work, take care of things at home, or get  along with other people Extremely dIfficult  Depression Interventions/Treatment      There were no vitals filed for this visit.  Medications Reviewed Today     Reviewed by Devra Lands, RN (Registered Nurse) on  10/03/23 at 1503  Med List Status: <None>   Medication Order Taking? Sig Documenting Provider Last Dose Status Informant  albuterol (VENTOLIN HFA) 108 (90 Base) MCG/ACT inhaler [id number] Yes Inhale 2 puffs into the lungs every 6 (six) hours as needed for wheezing or shortness of breath. [provider]  Active   ASHWAGANDHA PO [id number]  Take 2 each by mouth daily.  Patient not taking: Reported on 10/03/2023   [provider]  Active   busPIRone  (BUSPAR ) 10 MG tablet [id number]  Take 1 tablet (10 mg total) by mouth 2 (two) times daily.  Patient not taking: Reported on 10/03/2023   Watt Mirza, MD  Active   levonorgestrel  (MIRENA ) 20 MCG/DAY IUD [id number] Yes 1 each by Intrauterine route once. [provider]  Active   Multiple Vitamin (MULTIVITAMIN) LIQD [id number]  Take 5 mLs by mouth daily. Lysle Bash + Hair Growth  Patient not taking: Reported on 10/03/2023   [provider]  Active   Probiotic Product (PROBIOTIC PO) [id number]  Take 2 each by mouth daily.  Patient not taking: Reported on 10/03/2023   [provider]  Active   promethazine  (PHENERGAN ) 25 MG tablet [id number] Yes Take 1 tablet (25 mg total) by mouth every 6 (six) hours as needed for nausea or vomiting. Copland, Mirza, MD  Active   rizatriptan  (MAXALT ) 10 MG tablet [id number] Yes Take 1 tablet (10 mg total) by mouth as needed for migraine. May repeat in 2 hours if needed Copland, Mirza, MD  Active   topiramate  (TOPAMAX ) 100 MG tablet [id number] Yes Take 1 tablet (100 mg total) by mouth 2 (two) times daily. Watt Mirza, MD  Active             Recommendation:   PCP Follow-up LCSW and BSW follow up  Follow Up Plan:   Telephone follow-up in 1 month  Lands Devra, MSN, RN [facility]  [facility], [facility] RN Care Manager Direct Dial: [phone] Fax: [phone]

## 2023-10-09 ENCOUNTER — Other Ambulatory Visit: Payer: Self-pay | Admitting: Licensed Clinical Social Worker

## 2023-10-09 ENCOUNTER — Telehealth: Payer: Self-pay | Admitting: *Deleted

## 2023-10-09 NOTE — Patient Instructions (Signed)
 Visit Information  Thank you for taking time to visit with me today. Please don't hesitate to contact me if I can be of assistance to you before our next scheduled appointment.  Our next appointment is by telephone on 10/23/23 at 3pm Please call the care guide team at [phone] if you need to cancel or reschedule your appointment.   Following is a copy of your care plan:   Goals Addressed             This Visit's Progress    [facility] Social Work Care Plan LCSW       Problems:   Mental Health Concerns   CSW Clinical Goal(s):   Over the next 90 days the Patient will explore community resource options for unmet needs related to Depression and anxiety as evidence by initiating mental health/ therapy services.  Interventions:  Mental Health:  Evaluation of current treatment plan related to Depression: anxiety and depressed mood Active listening / Reflection utilized Behavioral Activation reviewed Depression screen reviewed Discussed referral options to connect for ongoing therapy: [facility] Provided Made referral to [facility] Reviewed mental health medications and discussed importance of compliance:   Suicidal Ideation/Homicidal Ideation assessed: 10/09/23 Patient denies being a harm to herself and others  Patient Goals/Self-Care Activities:  Call Department of Social Services [phone] to apply for medicaid. Continue taking your medication as prescribed.   Expect phone call from [facility] to make initial appointment for ongoing mental health therapy.  Plan:   Telephone follow up appointment with care management team member scheduled for:  10/23/23 3pm        Please call the Suicide and Crisis Lifeline: 988 call the [place]  National Suicide Prevention Lifeline: 262-278-8422 or TTY: 6471846255 TTY 4120038586) to talk to a trained counselor call 1-800-273-TALK (toll free, 24 hour hotline) call 911 if you are experiencing a  Mental Health or Behavioral Health Crisis or need someone to talk to.  Patient verbalizes understanding of instructions and care plan provided today and agrees to view in MyChart. Active MyChart status and patient understanding of how to access instructions and care plan via MyChart confirmed with patient.     Cena Ligas, LCSW Clinical Social Worker [facility]

## 2023-10-09 NOTE — Patient Outreach (Signed)
 Complex Care Management   Visit Note  10/09/2023  Name:  Cheyenne Rivera MRN: [id number] DOB: 20-Aug-1980  Situation: Referral received for Complex Care Management related to Mental/Behavioral Health diagnosis depression and anxiety I obtained verbal consent from Patient.  Visit completed with Patient  on the phone  Background:   Past Medical History:  Diagnosis Date   Anxiety    Depression    Family history of breast cancer 06/09/2010   IBS (irritable bowel syndrome) 01/15/2016   Migraine     Assessment: Patient Reported Symptoms:  Cognitive Cognitive Status: Alert and oriented to person, place, and time, Normal speech and language skills, Insightful and able to interpret abstract concepts Cognitive/Intellectual Conditions Management [RPT]: None reported or documented in medical history or problem list   Health Maintenance Behaviors: Annual physical exam Healing Pattern: Average Health Facilitated by: Healthy diet, Rest, Stress management  Neurological Neurological Review of Symptoms: Dizziness    HEENT HEENT Symptoms Reported: No symptoms reported      Cardiovascular Cardiovascular Symptoms Reported: Dizziness, Lightheadness Does patient have uncontrolled Hypertension?: No    Respiratory Respiratory Symptoms Reported: Shortness of breath Other Respiratory Symptoms: Asthma, covid in the past, PRN inhaler    Endocrine Endocrine Symptoms Reported: No symptoms reported Is patient diabetic?: No    Gastrointestinal Gastrointestinal Symptoms Reported: Diarrhea Additional Gastrointestinal Details: IBSD- manages with diet and medication      Genitourinary Genitourinary Symptoms Reported: No symptoms reported    Integumentary Integumentary Symptoms Reported: Bruising    Musculoskeletal Musculoskelatal Symptoms Reviewed: No symptoms reported Musculoskeletal Management Strategies: Adequate rest      Psychosocial Psychosocial Symptoms Reported: Anxiety - if selected complete  GAD, Depression - if selected complete PHQ 2-9 Additional Psychological Details: GAD7 and PHQ previously completed by [facility], patient increased anxiety and depression due to finanical strain and challenges with her medical condition. Behavioral Management Strategies: Medication therapy Behavioral Health Comment: Pt agreeable to referral for ongoing therapy [facility] Behaviors When Feeling Stressed/Fearful: Pt reports keeping herself busy, visiting her significant other and being around posive people helps with her symptoms. Techniques to Cope with Loss/Stress/Change: None Quality of Family Relationships: helpful, supportive Do you feel physically threatened by others?: No    10/09/2023    PHQ2-9 Depression Screening   Little interest or pleasure in doing things    Feeling down, depressed, or hopeless    PHQ-2 - Total Score    Trouble falling or staying asleep, or sleeping too much    Feeling tired or having little energy    Poor appetite or overeating     Feeling bad about yourself - or that you are a failure or have let yourself or your family down    Trouble concentrating on things, such as reading the newspaper or watching television    Moving or speaking so slowly that other people could have noticed.  Or the opposite - being so fidgety or restless that you have been moving around a lot more than usual    Thoughts that you would be better off dead, or hurting yourself in some way    PHQ2-9 Total Score    If you checked off any problems, how difficult have these problems made it for you to do your work, take care of things at home, or get along with other people    Depression Interventions/Treatment      There were no vitals filed for this visit.  Medications Reviewed Today     Reviewed by Veva,  Kaylia Winborne, LCSW (Child psychotherapist) on 10/09/23 at 1600  Med List Status: <None>   Medication Order Taking? Sig Documenting Provider Last Dose Status Informant  albuterol  (VENTOLIN HFA) 108 (90 Base) MCG/ACT inhaler [id number] Yes Inhale 2 puffs into the lungs every 6 (six) hours as needed for wheezing or shortness of breath. [provider]  Active   ASHWAGANDHA PO [id number]  Take 2 each by mouth daily.  Patient not taking: Reported on 10/09/2023   [provider]  Active   busPIRone  (BUSPAR ) 10 MG tablet [id number] Yes Take 1 tablet (10 mg total) by mouth 2 (two) times daily. Copland, Jacques, MD  Active   levonorgestrel  (MIRENA ) 20 MCG/DAY IUD [id number] Yes 1 each by Intrauterine route once. [provider]  Active   Multiple Vitamin (MULTIVITAMIN) LIQD [id number] Yes Take 5 mLs by mouth daily. Lysle Bash + Hair Growth [provider]  Active   Probiotic Product (PROBIOTIC PO) [id number] Yes Take 2 each by mouth daily. [provider]  Active   promethazine  (PHENERGAN ) 25 MG tablet [id number] Yes Take 1 tablet (25 mg total) by mouth every 6 (six) hours as needed for nausea or vomiting. Copland, Jacques, MD  Active   rizatriptan  (MAXALT ) 10 MG tablet [id number] Yes Take 1 tablet (10 mg total) by mouth as needed for migraine. May repeat in 2 hours if needed Copland, Jacques, MD  Active   topiramate  (TOPAMAX ) 100 MG tablet [id number] Yes Take 1 tablet (100 mg total) by mouth 2 (two) times daily. Watt Jacques, MD  Active             Recommendation:   PCP Follow-up Referral to: [facility] for ongoing mental health follow up  Follow Up Plan:   Telephone follow-up 10/23/2023 at 3pm  Cena Ligas, LCSW Clinical Social Worker [facility] Population [facility]

## 2023-10-13 ENCOUNTER — Other Ambulatory Visit: Payer: Self-pay

## 2023-10-13 NOTE — Patient Instructions (Signed)
 Visit Information  Thank you for taking time to visit with me today. Please don't hesitate to contact me if I can be of assistance to you before our next scheduled appointment.  Your next care management appointment is by telephone on 11/03/23 at 11am   Please call the care guide team at [phone] if you need to cancel, schedule, or reschedule an appointment.   Please call the Suicide and Crisis Lifeline: 988 call the [place]  National Suicide Prevention Lifeline: 340-527-0300 or TTY: (670)445-3689 TTY (252) 434-5691) to talk to a trained counselor call 1-800-273-TALK (toll free, 24 hour hotline) call 911 if you are experiencing a Mental Health or Behavioral Health Crisis or need someone to talk to.  Thersia Hoar, HEDWIG, MHA [place]  [facility] Social Worker, Population Health [phone]

## 2023-10-13 NOTE — Patient Outreach (Signed)
 Complex Care Management   Visit Note  10/13/2023  Name:  Cheyenne Rivera MRN: [id number] DOB: [birth year]  Situation: Referral received for Complex Care Management related to SDOH Barriers:  Housing affordable [employer] insurance I obtained verbal consent from Patient.  Visit completed with Patient  on the phone  Background:   Past Medical History:  Diagnosis Date   Anxiety    Depression    Family history of breast cancer 06/09/2010   IBS (irritable bowel syndrome) 01/15/2016   Migraine     Assessment: SW completed a telephone outreach with patient, she states she has found a place that she may apply for that is in her budge. Patient will apply for Medicaid over the weekend. Patient states she will be starting therapy soon. No resources are needed at this time. SW will continue to follow up with patient.  SDOH Interventions    Flowsheet Row Patient Outreach Telephone from 09/21/2023 in [place] POPULATION HEALTH DEPARTMENT Patient Outreach Telephone from 09/08/2023 in [place] POPULATION HEALTH DEPARTMENT Office Visit from 08/17/2023 in [facility] Office Visit from 06/07/2023 in [facility] [place]  SDOH Interventions      Food Insecurity Interventions Intervention Not Indicated  [food stamps now - need met] -- -- --  Housing Interventions AMB Referral, Community Resources Provided [employer] Provided -- --  Utilities Interventions Intervention Not Indicated -- -- --  Alcohol Usage Interventions Intervention Not Indicated (Score <7) -- -- --  Depression Interventions/Treatment  Counseling -- Counseling Counseling  Financial Strain Interventions Other (Comment)  [BSW] -- -- --  Physical Activity Interventions Intervention Not Indicated -- -- --  Stress Interventions Other (Comment)  [LCSW referral] -- -- --  Social Connections Interventions Intervention Not Indicated -- -- --      Recommendation:   No recommendations at this time  Follow Up Plan:   Telephone follow-up 11/03/23 at 11am  Thersia Hoar, BSW, MHA   [facility] Social Worker, Population Health [phone]

## 2023-10-23 ENCOUNTER — Other Ambulatory Visit: Payer: Self-pay | Admitting: Licensed Clinical Social Worker

## 2023-10-23 NOTE — Patient Instructions (Signed)
 Visit Information  Thank you for taking time to visit with me today. Please don't hesitate to contact me if I can be of assistance to you before our next scheduled appointment.  Your next care management appointment is by telephone on 11/07/23 at 2ppm.  Please call the care guide team at [phone] if you need to cancel, schedule, or reschedule an appointment.   Please call the Suicide and Crisis Lifeline: 988 call the [place]  National Suicide Prevention Lifeline: 236-102-4561 or TTY: (680) 620-4762 TTY 6282795067) to talk to a trained counselor call 1-800-273-TALK (toll free, 24 hour hotline) call 911 if you are experiencing a Mental Health or Behavioral Health Crisis or need someone to talk to.  Cena Ligas, LCSW Clinical Social Worker [facility] Population [facility]

## 2023-10-23 NOTE — Patient Outreach (Signed)
 Complex Care Management   Visit Note  10/23/2023  Name:  Cheyenne Rivera MRN: [id number] DOB: [birth year]  Situation: Referral received for Complex Care Management related to Mental/Behavioral Health diagnosis   I obtained verbal consent from Patient.  Visit completed with Patient  on the phone  Background:   Past Medical History:  Diagnosis Date   Anxiety    Depression    Family history of breast cancer 06/09/2010   IBS (irritable bowel syndrome) 01/15/2016   Migraine     Assessment: Patient Reported Symptoms:  Cognitive        Neurological      HEENT        Cardiovascular      Respiratory      Endocrine      Gastrointestinal        Genitourinary      Integumentary      Musculoskeletal          Psychosocial            10/23/2023    PHQ2-9 Depression Screening   Little interest or pleasure in doing things    Feeling down, depressed, or hopeless    PHQ-2 - Total Score    Trouble falling or staying asleep, or sleeping too much    Feeling tired or having little energy    Poor appetite or overeating     Feeling bad about yourself - or that you are a failure or have let yourself or your family down    Trouble concentrating on things, such as reading the newspaper or watching television    Moving or speaking so slowly that other people could have noticed.  Or the opposite - being so fidgety or restless that you have been moving around a lot more than usual    Thoughts that you would be better off dead, or hurting yourself in some way    PHQ2-9 Total Score    If you checked off any problems, how difficult have these problems made it for you to do your work, take care of things at home, or get along with other people    Depression Interventions/Treatment      There were no vitals filed for this visit.  Medications Reviewed Today     Reviewed by Veva Bolt, LCSW (Social Worker) on 10/23/23 at 1527  Med List Status: <None>   Medication Order Taking?  Sig Documenting Provider Last Dose Status Informant  albuterol (VENTOLIN HFA) 108 (90 Base) MCG/ACT inhaler [id number]  Inhale 2 puffs into the lungs every 6 (six) hours as needed for wheezing or shortness of breath. [provider]  Active   ASHWAGANDHA PO [id number]  Take 2 each by mouth daily.  Patient not taking: Reported on 10/09/2023   [provider]  Active   busPIRone  (BUSPAR ) 10 MG tablet [id number]  Take 1 tablet (10 mg total) by mouth 2 (two) times daily. Copland, Jacques, MD  Active   levonorgestrel  (MIRENA ) 20 MCG/DAY IUD [id number]  1 each by Intrauterine route once. [provider]  Active   Multiple Vitamin (MULTIVITAMIN) LIQD [id number]  Take 5 mLs by mouth daily. Lysle Bash + Hair Growth [provider]  Active   Probiotic Product (PROBIOTIC PO) [id number]  Take 2 each by mouth daily. [provider]  Active   promethazine  (PHENERGAN ) 25 MG tablet [id number]  Take 1 tablet (25 mg total) by mouth every 6 (six) hours as needed for nausea or vomiting. Watt Jacques, MD  Active   rizatriptan  (MAXALT ) 10 MG tablet [id number]  Take 1 tablet (10 mg total) by mouth as needed for migraine. May repeat in 2 hours if needed Copland, Jacques, MD  Active   topiramate  (TOPAMAX ) 100 MG tablet [id number]  Take 1 tablet (100 mg total) by mouth 2 (two) times daily. Watt Jacques, MD  Active             Recommendation:   PCP Follow-up [facility]  Follow Up Plan:   Telephone follow up appointment date/time:  11/07/23 at 2pm  Cena Ligas, LCSW Clinical Social Worker [facility]

## 2023-11-01 ENCOUNTER — Other Ambulatory Visit: Payer: Self-pay

## 2023-11-01 NOTE — Patient Instructions (Signed)
 Visit Information  Thank you for taking time to visit with me today. Please don't hesitate to contact me if I can be of assistance to you before our next scheduled appointment.  Your next care management appointment is by telephone on 11/20/2023 at 9:00 am  Telephone follow-up in 1 month  Please call the care guide team at [phone] if you need to cancel, schedule, or reschedule an appointment.   Please call the Suicide and Crisis Lifeline: 988 call the [place]  National Suicide Prevention Lifeline: (405)385-6342 or TTY: (361) 508-3052 TTY 786 528 3436) to talk to a trained counselor call 1-800-273-TALK (toll free, 24 hour hotline) go to [facility] [address] [place] [phone]) call 911 if you are experiencing a Mental Health or Behavioral Health Crisis or need someone to talk to.  Nestora Duos, MSN, RN [facility], [facility] RN Care Manager Direct Dial: [phone] Fax: [phone]

## 2023-11-01 NOTE — Patient Outreach (Signed)
 Complex Care Management   Visit Note  11/01/2023  Name:  Cheyenne Rivera MRN: [id number] DOB: 10-04-1980  Situation: Referral received for Complex Care Management related to Migraine I obtained verbal consent from Patient.  Visit completed with Patient  on the phone  Background:   Past Medical History:  Diagnosis Date   Anxiety    Depression    Family history of breast cancer 06/09/2010   IBS (irritable bowel syndrome) 01/15/2016   Migraine     Assessment: Patient Reported Symptoms:  Cognitive Cognitive/Intellectual Conditions Management [RPT]: None reported or documented in medical history or problem list   Health Maintenance Behaviors: Annual physical exam  Neurological   Neurological Management Strategies: Routine screening, Medication therapy Neurological Comment: less frequent migraine  HEENT HEENT Symptoms Reported: No symptoms reported      Cardiovascular Cardiovascular Symptoms Reported: No symptoms reported Does patient have uncontrolled Hypertension?: No Cardiovascular Management Strategies: Routine screening  Respiratory Respiratory Symptoms Reported: No symptoms reported Other Respiratory Symptoms: seasonal alleriges/sinus but improved Respiratory Management Strategies: Routine screening, Medication therapy  Endocrine Endocrine Symptoms Reported: Not assessed Is patient diabetic?: No    Gastrointestinal Gastrointestinal Symptoms Reported: No symptoms reported Gastrointestinal Management Strategies: Diet modification    Genitourinary Genitourinary Symptoms Reported: No symptoms reported Additional Genitourinary Details: plans to apply for Medicaid then do screenings    Integumentary Integumentary Symptoms Reported: No symptoms reported    Musculoskeletal Musculoskelatal Symptoms Reviewed: No symptoms reported   Falls in the past year?: No Number of falls in past year: 1 or less Was there an injury with Fall?: No Fall Risk Category Calculator: 0 Patient  Fall Risk Level: Low Fall Risk Patient at Risk for Falls Due to: No Fall Risks  Psychosocial Psychosocial Symptoms Reported: Anxiety - if selected complete GAD Additional Psychological Details: still some anxiety but improved, getting out of bed, training for new business, found place she is applying for own housing, LCSW recommended [facility], will call them back for appointment Behavioral Management Strategies: Medication therapy        11/01/2023    PHQ2-9 Depression Screening   Little interest or pleasure in doing things Several days  Feeling down, depressed, or hopeless Several days  PHQ-2 - Total Score 2  Trouble falling or staying asleep, or sleeping too much Several days (trouble falling asleep)  Feeling tired or having little energy Several days  Poor appetite or overeating  Several days  Feeling bad about yourself - or that you are a failure or have let yourself or your family down Several days  Trouble concentrating on things, such as reading the newspaper or watching television Several days  Moving or speaking so slowly that other people could have noticed.  Or the opposite - being so fidgety or restless that you have been moving around a lot more than usual Not at all  Thoughts that you would be better off dead, or hurting yourself in some way Not at all  PHQ2-9 Total Score 7  If you checked off any problems, how difficult have these problems made it for you to do your work, take care of things at home, or get along with other people    Depression Interventions/Treatment      There were no vitals filed for this visit.  Medications Reviewed Today     Reviewed by Devra Lands, RN (Registered Nurse) on 11/01/23 at 1504  Med List Status: <None>   Medication Order Taking? Sig Documenting Provider Last Dose Status Informant  albuterol (VENTOLIN HFA) 108 (90 Base) MCG/ACT inhaler [id number] Yes Inhale 2 puffs into the lungs every 6 (six) hours as needed for wheezing  or shortness of breath. [provider]  Active   ASHWAGANDHA PO [id number]  Take 2 each by mouth daily.  Patient not taking: Reported on 10/09/2023   [provider]  Active   busPIRone  (BUSPAR ) 10 MG tablet [id number] Yes Take 1 tablet (10 mg total) by mouth 2 (two) times daily. Copland, Jacques, MD  Active   levonorgestrel  (MIRENA ) 20 MCG/DAY IUD [id number] Yes 1 each by Intrauterine route once. [provider]  Active   Multiple Vitamin (MULTIVITAMIN) LIQD [id number] Yes Take 5 mLs by mouth daily. Lysle Bash + Hair Growth [provider]  Active   Probiotic Product (PROBIOTIC PO) [id number] Yes Take 2 each by mouth daily. [provider]  Active   promethazine  (PHENERGAN ) 25 MG tablet [id number] Yes Take 1 tablet (25 mg total) by mouth every 6 (six) hours as needed for nausea or vomiting. Copland, Jacques, MD  Active   rizatriptan  (MAXALT ) 10 MG tablet [id number] Yes Take 1 tablet (10 mg total) by mouth as needed for migraine. May repeat in 2 hours if needed Copland, Jacques, MD  Active   topiramate  (TOPAMAX ) 100 MG tablet [id number] Yes Take 1 tablet (100 mg total) by mouth 2 (two) times daily. Watt Jacques, MD  Active             Recommendation:   PCP Follow-up Continue Current Plan of Care  Follow Up Plan:   Telephone follow-up in 1 month  Nestora Duos, MSN, RN [facility]  [facility], [facility] RN Care Manager Direct Dial: [phone] Fax: [phone]

## 2023-11-03 ENCOUNTER — Other Ambulatory Visit: Payer: Self-pay

## 2023-11-03 NOTE — Patient Instructions (Signed)

## 2023-11-03 NOTE — Patient Outreach (Signed)
 Complex Care Management   Visit Note  11/03/2023  Name:  Cheyenne Rivera MRN: [id number] DOB: Aug 10, 1980  Situation: Referral received for Complex Care Management related to applying for Medicaid I obtained verbal consent from Patient.  Visit completed with Patient  on the phone  Background:   Past Medical History:  Diagnosis Date   Anxiety    Depression    Family history of breast cancer 06/09/2010   IBS (irritable bowel syndrome) 01/15/2016   Migraine     Assessment:  SW completed a telephone outreach with patient she states she has not applied for Medicaid but would be applying today. Patient states she is okay right now and does not have any additional needs at this time. SW ensured patient has contact information for any future needs.  SDOH Interventions    Flowsheet Row Patient Outreach Telephone from 11/01/2023 in [place] POPULATION HEALTH DEPARTMENT Patient Outreach Telephone from 09/21/2023 in [place] POPULATION HEALTH DEPARTMENT Patient Outreach Telephone from 09/08/2023 in [place] POPULATION HEALTH DEPARTMENT Office Visit from 08/17/2023 in [facility] Visit from 06/07/2023 in [facility] [place]  SDOH Interventions       Food Insecurity Interventions Intervention Not Indicated  [getting food stamps - no further needs] Intervention Not Indicated  [food stamps now - need met] -- -- --  Housing Interventions Intervention Not Indicated  [applying for own housing] AMB Referral, Programmer, applications Provided [employer] Provided -- --  Utilities Interventions -- Intervention Not Indicated -- -- --  Alcohol Usage Interventions -- Intervention Not Indicated (Score <7) -- -- --  Depression Interventions/Treatment  -- Counseling -- Counseling Counseling  Financial Strain Interventions -- Other (Comment)  [BSW] -- -- --  Physical Activity Interventions -- Intervention Not Indicated -- -- --  Stress  Interventions -- Other (Comment)  [LCSW referral] -- -- --  Social Connections Interventions -- Intervention Not Indicated -- -- --    Recommendation:   No recommendations at this time  Follow Up Plan:   Patient has met all care management goals. Care Management case will be closed. Patient has been provided contact information should new needs arise.   Thersia Hoar, HEDWIG, MHA [place]  [facility] Social Worker, Population Health [phone]

## 2023-11-07 ENCOUNTER — Other Ambulatory Visit: Payer: Self-pay | Admitting: Licensed Clinical Social Worker

## 2023-11-07 NOTE — Patient Outreach (Signed)
 Complex Care Management   Visit Note  11/07/2023  Name:  Cheyenne Rivera MRN: [id number] DOB: [birth year]  Situation: Referral received for Complex Care Management related to Mental/Behavioral Health diagnosis Depression. I obtained verbal consent from Patient.  Visit completed with Patient  on the phone. Patient confirmed that she has been connected to [facility] for [facility]. Patient reports no other needs.  Background:   Past Medical History:  Diagnosis Date   Anxiety    Depression    Family history of breast cancer 06/09/2010   IBS (irritable bowel syndrome) 01/15/2016   Migraine     Assessment: Patient Reported Symptoms:  Cognitive Cognitive Status: Normal speech and language skills, Alert and oriented to person, place, and time      Neurological Neurological Review of Symptoms: Not assessed    HEENT HEENT Symptoms Reported: Not assessed      Cardiovascular Cardiovascular Symptoms Reported: Not assessed    Respiratory Respiratory Symptoms Reported: Not assesed    Endocrine Endocrine Symptoms Reported: Not assessed    Gastrointestinal Gastrointestinal Symptoms Reported: Not assessed      Genitourinary Genitourinary Symptoms Reported: Not assessed    Integumentary Integumentary Symptoms Reported: Not assessed    Musculoskeletal Musculoskelatal Symptoms Reviewed: Not assessed        Psychosocial Psychosocial Symptoms Reported: Not assessed          11/07/2023    PHQ2-9 Depression Screening   Little interest or pleasure in doing things    Feeling down, depressed, or hopeless    PHQ-2 - Total Score    Trouble falling or staying asleep, or sleeping too much    Feeling tired or having little energy    Poor appetite or overeating     Feeling bad about yourself - or that you are a failure or have let yourself or your family down    Trouble concentrating on things, such as reading the newspaper or watching television    Moving or speaking so slowly that  other people could have noticed.  Or the opposite - being so fidgety or restless that you have been moving around a lot more than usual    Thoughts that you would be better off dead, or hurting yourself in some way    PHQ2-9 Total Score    If you checked off any problems, how difficult have these problems made it for you to do your work, take care of things at home, or get along with other people    Depression Interventions/Treatment      There were no vitals filed for this visit.  Medications Reviewed Today   Medications were not reviewed in this encounter     Recommendation:   PCP Follow-up Continue Current Plan of Care  Follow Up Plan:   Closing From:  Complex Care Management  Cena Ligas, LCSW Clinical Social Worker [facility]

## 2023-11-08 NOTE — Patient Instructions (Signed)
 Visit Information  Thank you for taking time to visit with me today.   Closing From: Complex Care Management from LCSW.   Please call 911 if you are experiencing a Mental Health or Behavioral Health Crisis or need someone to talk to.  Cena Ligas, LCSW Clinical Social Worker [facility]

## 2023-11-20 ENCOUNTER — Other Ambulatory Visit: Payer: Self-pay

## 2023-11-20 NOTE — Patient Instructions (Signed)
 Visit Information  Thank you for taking time to visit with me today. Please don't hesitate to contact me if I can be of assistance to you before our next scheduled appointment.  Your next care management appointment is by telephone on 12/18/2023 at 9:00 am  Telephone follow-up in 1 month  Please call the care guide team at [phone] if you need to cancel, schedule, or reschedule an appointment.   Please call the Suicide and Crisis Lifeline: 988 call the [place]  National Suicide Prevention Lifeline: (469)393-4770 or TTY: (770)708-9417 TTY (413) 620-4848) to talk to a trained counselor call 1-800-273-TALK (toll free, 24 hour hotline) go to [facility] [address], [place] [phone]) call 911 if you are experiencing a Mental Health or Behavioral Health Crisis or need someone to talk to.  Nestora Duos, MSN, RN [facility], [facility] RN Care Manager Direct Dial: [phone] Fax: [phone]

## 2023-11-20 NOTE — Patient Outreach (Signed)
 Complex Care Management   Visit Note  11/20/2023  Name:  Cheyenne Rivera MRN: [id number] DOB: 17-Jul-1980  Situation: Referral received for Complex Care Management related to Migraine Pain I obtained verbal consent from Patient.  Visit completed with Patient  on the phone  Background:   Past Medical History:  Diagnosis Date   Anxiety    Depression    Family history of breast cancer 06/09/2010   IBS (irritable bowel syndrome) 01/15/2016   Migraine     Assessment: Patient Reported Symptoms:  Cognitive Cognitive Status: No symptoms reported Cognitive/Intellectual Conditions Management [RPT]: None reported or documented in medical history or problem list   Health Maintenance Behaviors: Annual physical exam Health Facilitated by: Healthy diet  Neurological Neurological Review of Symptoms: No symptoms reported    HEENT HEENT Symptoms Reported: Other:      Cardiovascular Cardiovascular Symptoms Reported: No symptoms reported Cardiovascular Management Strategies: Routine screening  Respiratory Respiratory Symptoms Reported: Productive cough, Other: Other Respiratory Symptoms: recovering from sinus infection, went to a provider she has seen before - does not accept insurance and cost is cheaper than UC/PCP, prescribed prednisone  (completing today) and 10 day course of Augmentin  (started 11/5), also using Mucinex and Delsym and prn inhaler, reports feeling better, no SOB, wheeze or sx of fever, slight productive cough but mostly dry, aware if starts feeling worse of new sx to contact provider promptly Respiratory Management Strategies: Routine screening  Endocrine Endocrine Symptoms Reported: Not assessed Is patient diabetic?: No    Gastrointestinal Gastrointestinal Symptoms Reported: No symptoms reported Additional Gastrointestinal Details: reports eating more being on prednisone       Genitourinary Genitourinary Symptoms Reported: No symptoms reported    Integumentary Integumentary  Symptoms Reported: No symptoms reported    Musculoskeletal Musculoskelatal Symptoms Reviewed: No symptoms reported   Falls in the past year?: No Number of falls in past year: 1 or less Was there an injury with Fall?: No Fall Risk Category Calculator: 0 Patient Fall Risk Level: Low Fall Risk Patient at Risk for Falls Due to: No Fall Risks Fall risk Follow up: Falls evaluation completed  Psychosocial Psychosocial Symptoms Reported: Irritability Additional Psychological Details: irritable on prednisone , anxiety and depression controlled,  I have moments, not days on schedule, being more active reports making progress, applied for Medicaid, got car for daughter, working on housing and job, advised to call [facility] for appointment as reports has not heard from them. taking meds regularly and couseling through church right now Behavioral Management Strategies: Medication therapy, Counseling, Activity, Exercise        11/20/2023    PHQ2-9 Depression Screening   Little interest or pleasure in doing things Several days  Feeling down, depressed, or hopeless Several days  PHQ-2 - Total Score 2  Trouble falling or staying asleep, or sleeping too much Nearly every day  Feeling tired or having little energy More than half the days  Poor appetite or overeating  Several days (prednisone )  Feeling bad about yourself - or that you are a failure or have let yourself or your family down Several days  Trouble concentrating on things, such as reading the newspaper or watching television Several days  Moving or speaking so slowly that other people could have noticed.  Or the opposite - being so fidgety or restless that you have been moving around a lot more than usual Not at all  Thoughts that you would be better off dead, or hurting yourself in some way Not at all  [facility]  Total Score 10  If you checked off any problems, how difficult have these problems made it for you to do your work, take care  of things at home, or get along with other people Somewhat difficult  Depression Interventions/Treatment Counseling (church counelsing -advised to call [facility] for appointment states has not heard from them)    There were no vitals filed for this visit.  Medications Reviewed Today   Medications were not reviewed in this encounter     Recommendation:   PCP Follow-up Continue Current Plan of Care  Follow Up Plan:   Telephone follow-up in 1 month  Nestora Duos, MSN, RN [facility]  [facility], [facility] RN Care Manager Direct Dial: [phone] Fax: [phone]

## 2023-12-18 ENCOUNTER — Telehealth: Payer: Self-pay

## 2023-12-18 ENCOUNTER — Other Ambulatory Visit: Payer: Self-pay

## 2023-12-18 NOTE — Patient Instructions (Signed)
 Visit Information  Thank you for taking time to visit with me today. Please don't hesitate to contact me if I can be of assistance to you before our next scheduled appointment.  Your next care management appointment is by telephone on 01/15/2024 at 9:30 am  Telephone follow-up in 1 month  Please call the care guide team at [phone] if you need to cancel, schedule, or reschedule an appointment.   Please call the Suicide and Crisis Lifeline: 988 call the [place]  National Suicide Prevention Lifeline: 812-298-9667 or TTY: 2314117446 TTY 917-050-1770) to talk to a trained counselor call 1-800-273-TALK (toll free, 24 hour hotline) go to [facility] [address] [place] [phone]) call 911 if you are experiencing a Mental Health or Behavioral Health Crisis or need someone to talk to.  Nestora Duos, MSN, RN [facility], [facility] RN Care Manager Direct Dial: [phone] Fax: [phone]'

## 2023-12-18 NOTE — Patient Outreach (Signed)
 Complex Care Management   Visit Note  12/18/2023  Name:  Cheyenne Rivera MRN: [id number] DOB: [birth year]  Situation: Referral received for Complex Care Management related to Migraines I obtained verbal consent from Patient.  Visit completed with Patient  on the phone  Background:   Past Medical History:  Diagnosis Date   Anxiety    Depression    Family history of breast cancer 06/09/2010   IBS (irritable bowel syndrome) 01/15/2016   Migraine     Assessment: Patient Reported Symptoms:  Cognitive Cognitive Status: No symptoms reported Cognitive/Intellectual Conditions Management [RPT]: None reported or documented in medical history or problem list   Health Maintenance Behaviors: Annual physical exam  Neurological Neurological Review of Symptoms: Headaches Neurological Management Strategies: Routine screening, Medication therapy Neurological Comment: still with occasional migraines  HEENT HEENT Symptoms Reported: No symptoms reported HEENT Management Strategies: Routine screening    Cardiovascular Cardiovascular Symptoms Reported: No symptoms reported Does patient have uncontrolled Hypertension?: No Cardiovascular Management Strategies: Routine screening  Respiratory Respiratory Symptoms Reported: Other: Other Respiratory Symptoms: Has not needed to see private pay provider since last call - sinus infection resolved, using inhaler as needed. Discussed sx requiring eval. Respiratory Management Strategies: Routine screening, Medication therapy  Endocrine Endocrine Symptoms Reported: Not assessed Is patient diabetic?: No    Gastrointestinal Gastrointestinal Symptoms Reported: No symptoms reported      Genitourinary Genitourinary Symptoms Reported: No symptoms reported Additional Genitourinary Details: awaiting Medicaid decision    Integumentary Integumentary Symptoms Reported: No symptoms reported    Musculoskeletal Musculoskelatal Symptoms Reviewed: No symptoms reported    Falls in the past year?: No Number of falls in past year: 1 or less Was there an injury with Fall?: No Fall Risk Category Calculator: 0 Patient Fall Risk Level: Low Fall Risk Patient at Risk for Falls Due to: No Fall Risks Fall risk Follow up: Falls evaluation completed  Psychosocial Psychosocial Symptoms Reported: No symptoms reported Additional Psychological Details: reports feeling good re mood, did not hear back from [facility] for appointment and is doing some counseling through church, finding helpful, reports job not going as planned - not making money yet but doing ok - no needs at this time, sleep remains difficult/erratic schedule Behavioral Management Strategies: Medication therapy, Activity, Counseling Major Change/Loss/Stressor/Fears (CP): Environment, School or job, Resources, Astronomer or divorce Behaviors When Feeling Stressed/Fearful: reports doing well overall Do you feel physically threatened by others?: No    12/18/2023    PHQ2-9 Depression Screening   Little interest or pleasure in doing things Several days  Feeling down, depressed, or hopeless Several days  PHQ-2 - Total Score 2  Trouble falling or staying asleep, or sleeping too much Nearly every day  Feeling tired or having little energy    Poor appetite or overeating  Several days  Feeling bad about yourself - or that you are a failure or have let yourself or your family down Several days  Trouble concentrating on things, such as reading the newspaper or watching television Not at all  Moving or speaking so slowly that other people could have noticed.  Or the opposite - being so fidgety or restless that you have been moving around a lot more than usual Not at all  Thoughts that you would be better off dead, or hurting yourself in some way Not at all  PHQ2-9 Total Score 7  If you checked off any problems, how difficult have these problems made it for you to do your work, take care  of things at home, or get  along with other people    Depression Interventions/Treatment      There were no vitals filed for this visit.    Medications Reviewed Today     Reviewed by Devra Lands, RN (Registered Nurse) on 12/18/23 at [id number]  Med List Status: <None>   Medication Order Taking? Sig Documenting Provider Last Dose Status Informant  albuterol (VENTOLIN HFA) 108 (90 Base) MCG/ACT inhaler [id number] Yes Inhale 2 puffs into the lungs every 6 (six) hours as needed for wheezing or shortness of breath. [provider]  Active   ASHWAGANDHA PO [id number] Yes Take 2 each by mouth daily. [provider]  Active   busPIRone  (BUSPAR ) 10 MG tablet [id number] Yes Take 1 tablet (10 mg total) by mouth 2 (two) times daily. Copland, Jacques, MD  Active   levonorgestrel  (MIRENA ) 20 MCG/DAY IUD [id number] Yes 1 each by Intrauterine route once. [provider]  Active   Multiple Vitamin (MULTIVITAMIN) LIQD [id number] Yes Take 5 mLs by mouth daily. Lysle Bash + Hair Growth [provider]  Active   Probiotic Product (PROBIOTIC PO) [id number] Yes Take 2 each by mouth daily. [provider]  Active   promethazine  (PHENERGAN ) 25 MG tablet [id number] Yes Take 1 tablet (25 mg total) by mouth every 6 (six) hours as needed for nausea or vomiting. Copland, Jacques, MD  Active   rizatriptan  (MAXALT ) 10 MG tablet [id number] Yes Take 1 tablet (10 mg total) by mouth as needed for migraine. May repeat in 2 hours if needed Copland, Jacques, MD  Active   topiramate  (TOPAMAX ) 100 MG tablet [id number] Yes Take 1 tablet (100 mg total) by mouth 2 (two) times daily. Watt Jacques, MD  Active             Recommendation:   PCP Follow-up Continue Current Plan of Care Complete [place] [employer] App  Follow Up Plan:   Telephone follow-up in 1 month  Lands Devra, MSN, RN [facility]  [facility], [facility] RN Care Manager Direct Dial: [phone] Fax:  [phone]

## 2024-01-15 ENCOUNTER — Telehealth: Payer: Self-pay

## 2024-01-18 NOTE — Patient Outreach (Signed)
 RNCM called to reschedule missed appointment, left VM requesting call back.

## 2024-01-30 NOTE — Patient Outreach (Signed)
 RNCM - UTR x3 to reschedule follow up. Left message with direct number, notified of case closure and can either call me or request new referral form PCP in future if patient would like to resume services. PCP notified.

## 2024-08-12 ENCOUNTER — Other Ambulatory Visit: Payer: Self-pay

## 2024-08-19 ENCOUNTER — Encounter: Payer: Self-pay | Admitting: Family Medicine
